# Patient Record
Sex: Female | Born: 1935 | Race: White | Hispanic: No | Marital: Married
Health system: Southern US, Community
[De-identification: ages and names within clinical notes are randomized; demographics above are authoritative.]

---

## 2003-06-09 ENCOUNTER — Other Ambulatory Visit: Payer: Self-pay

## 2004-07-17 ENCOUNTER — Ambulatory Visit: Payer: Self-pay | Admitting: Unknown Physician Specialty

## 2004-12-26 ENCOUNTER — Ambulatory Visit: Payer: Self-pay | Admitting: Internal Medicine

## 2005-01-16 ENCOUNTER — Ambulatory Visit: Payer: Self-pay | Admitting: Vascular Surgery

## 2005-03-19 ENCOUNTER — Ambulatory Visit: Payer: Self-pay | Admitting: Internal Medicine

## 2006-03-20 ENCOUNTER — Ambulatory Visit: Payer: Self-pay | Admitting: Internal Medicine

## 2007-04-01 ENCOUNTER — Ambulatory Visit: Payer: Self-pay | Admitting: Internal Medicine

## 2008-04-28 ENCOUNTER — Inpatient Hospital Stay: Payer: Self-pay | Admitting: Internal Medicine

## 2008-08-20 ENCOUNTER — Ambulatory Visit: Payer: Self-pay

## 2010-03-15 ENCOUNTER — Emergency Department: Payer: Self-pay | Admitting: Emergency Medicine

## 2011-02-21 ENCOUNTER — Emergency Department: Payer: Self-pay | Admitting: Unknown Physician Specialty

## 2011-06-19 ENCOUNTER — Emergency Department: Payer: Self-pay | Admitting: Emergency Medicine

## 2011-06-19 LAB — URINALYSIS, COMPLETE
Bacteria: NONE SEEN
Bilirubin,UR: NEGATIVE
Blood: NEGATIVE
Glucose,UR: NEGATIVE mg/dL (ref 0–75)
Nitrite: NEGATIVE
Ph: 7 (ref 4.5–8.0)
Protein: NEGATIVE
RBC,UR: NONE SEEN /HPF (ref 0–5)
Specific Gravity: 1.004 (ref 1.003–1.030)
Squamous Epithelial: <1
WBC UR: <1 /HPF (ref 0–5)

## 2011-06-19 LAB — COMPREHENSIVE METABOLIC PANEL
Alkaline Phosphatase: 53 U/L (ref 50–136)
BUN: 30 mg/dL — ABNORMAL HIGH (ref 7–18)
Bilirubin,Total: 0.3 mg/dL (ref 0.2–1.0)
Chloride: 101 mmol/L (ref 98–107)
Co2: 28 mmol/L (ref 21–32)
Creatinine: 1.56 mg/dL — ABNORMAL HIGH (ref 0.60–1.30)
EGFR (African American): 40 — ABNORMAL LOW
EGFR (Non-African Amer.): 33 — ABNORMAL LOW
Potassium: 4.1 mmol/L (ref 3.5–5.1)
SGPT (ALT): 17 U/L
Sodium: 143 mmol/L (ref 136–145)
Total Protein: 7.7 g/dL (ref 6.4–8.2)

## 2011-06-19 LAB — CBC
HCT: 35.8 % (ref 35.0–47.0)
HGB: 12.1 g/dL (ref 12.0–16.0)
MCV: 98 fL (ref 80–100)
RBC: 3.64 10*6/uL — ABNORMAL LOW (ref 3.80–5.20)
WBC: 7.9 10*3/uL (ref 3.6–11.0)

## 2011-06-19 LAB — LIPASE, BLOOD: Lipase: 343 U/L (ref 73–393)

## 2013-02-01 ENCOUNTER — Inpatient Hospital Stay: Payer: Self-pay | Admitting: Internal Medicine

## 2013-02-01 LAB — COMPREHENSIVE METABOLIC PANEL
Albumin: 3 g/dL — ABNORMAL LOW (ref 3.4–5.0)
BUN: 40 mg/dL — ABNORMAL HIGH (ref 7–18)
Chloride: 100 mmol/L (ref 98–107)
EGFR (African American): 25 — ABNORMAL LOW
EGFR (Non-African Amer.): 22 — ABNORMAL LOW
Osmolality: 283 (ref 275–301)
Potassium: 4.3 mmol/L (ref 3.5–5.1)
SGOT(AST): 42 U/L — ABNORMAL HIGH (ref 15–37)
SGPT (ALT): 21 U/L (ref 12–78)
Sodium: 136 mmol/L (ref 136–145)

## 2013-02-01 LAB — URINALYSIS, COMPLETE
Bilirubin,UR: NEGATIVE
Blood: NEGATIVE
Glucose,UR: NEGATIVE mg/dL (ref 0–75)
Hyaline Cast: 27
Ph: 6 (ref 4.5–8.0)
Protein: 25
Squamous Epithelial: 7

## 2013-02-01 LAB — CBC
HCT: 40.6 % (ref 35.0–47.0)
HGB: 13.6 g/dL (ref 12.0–16.0)
MCHC: 33.4 g/dL (ref 32.0–36.0)
MCV: 96 fL (ref 80–100)
RDW: 13.4 % (ref 11.5–14.5)

## 2013-02-02 LAB — CBC WITH DIFFERENTIAL/PLATELET
Basophil #: 0 10*3/uL (ref 0.0–0.1)
Basophil %: 0.2 %
HGB: 12 g/dL (ref 12.0–16.0)
Lymphocyte #: 1.5 10*3/uL (ref 1.0–3.6)
MCHC: 33.7 g/dL (ref 32.0–36.0)
Monocyte #: 1.1 x10 3/mm — ABNORMAL HIGH (ref 0.2–0.9)
Monocyte %: 8.9 %
Neutrophil %: 74.8 %
Platelet: 274 10*3/uL (ref 150–440)

## 2013-02-02 LAB — BASIC METABOLIC PANEL
Anion Gap: 7 (ref 7–16)
BUN: 36 mg/dL — ABNORMAL HIGH (ref 7–18)
Chloride: 105 mmol/L (ref 98–107)
EGFR (African American): 31 — ABNORMAL LOW
EGFR (Non-African Amer.): 26 — ABNORMAL LOW
Glucose: 91 mg/dL (ref 65–99)
Osmolality: 287 (ref 275–301)
Sodium: 140 mmol/L (ref 136–145)

## 2013-02-03 LAB — COMPREHENSIVE METABOLIC PANEL
Alkaline Phosphatase: 57 U/L (ref 50–136)
Bilirubin,Total: 0.4 mg/dL (ref 0.2–1.0)
Chloride: 104 mmol/L (ref 98–107)
Creatinine: 1.56 mg/dL — ABNORMAL HIGH (ref 0.60–1.30)
EGFR (African American): 33 — ABNORMAL LOW
Sodium: 138 mmol/L (ref 136–145)

## 2013-02-03 LAB — WBC: WBC: 10.8 10*3/uL (ref 3.6–11.0)

## 2013-02-04 LAB — URINE CULTURE

## 2013-02-06 LAB — CULTURE, BLOOD (SINGLE)

## 2013-09-11 ENCOUNTER — Ambulatory Visit: Payer: Self-pay | Admitting: Nurse Practitioner

## 2013-09-18 ENCOUNTER — Inpatient Hospital Stay: Payer: Self-pay | Admitting: Student

## 2013-09-18 LAB — CBC
HCT: 35.3 % (ref 35.0–47.0)
HGB: 12 g/dL (ref 12.0–16.0)
MCH: 33.5 pg (ref 26.0–34.0)
MCHC: 34 g/dL (ref 32.0–36.0)
MCV: 99 fL (ref 80–100)
PLATELETS: 171 10*3/uL (ref 150–440)
RBC: 3.58 10*6/uL — ABNORMAL LOW (ref 3.80–5.20)
RDW: 14.4 % (ref 11.5–14.5)
WBC: 10 10*3/uL (ref 3.6–11.0)

## 2013-09-18 LAB — URINALYSIS, COMPLETE
Bilirubin,UR: NEGATIVE
Blood: NEGATIVE
GLUCOSE, UR: NEGATIVE mg/dL (ref 0–75)
NITRITE: NEGATIVE
PH: 5 (ref 4.5–8.0)
Protein: NEGATIVE
SPECIFIC GRAVITY: 1.018 (ref 1.003–1.030)
Squamous Epithelial: 1
WBC UR: 2 /HPF (ref 0–5)

## 2013-09-18 LAB — COMPREHENSIVE METABOLIC PANEL
Albumin: 3.4 g/dL (ref 3.4–5.0)
Alkaline Phosphatase: 51 U/L
Anion Gap: 8 (ref 7–16)
BUN: 21 mg/dL — ABNORMAL HIGH (ref 7–18)
Bilirubin,Total: 0.5 mg/dL (ref 0.2–1.0)
CALCIUM: 9.1 mg/dL (ref 8.5–10.1)
CREATININE: 1.11 mg/dL (ref 0.60–1.30)
Chloride: 100 mmol/L (ref 98–107)
Co2: 26 mmol/L (ref 21–32)
EGFR (African American): 49 — ABNORMAL LOW
GFR CALC NON AF AMER: 42 — AB
GLUCOSE: 112 mg/dL — AB (ref 65–99)
Osmolality: 272 (ref 275–301)
POTASSIUM: 4.2 mmol/L (ref 3.5–5.1)
SGOT(AST): 29 U/L (ref 15–37)
SGPT (ALT): 12 U/L (ref 12–78)
Sodium: 134 mmol/L — ABNORMAL LOW (ref 136–145)
Total Protein: 7.4 g/dL (ref 6.4–8.2)

## 2013-09-18 LAB — PROTIME-INR
INR: 1.1
PROTHROMBIN TIME: 13.9 s (ref 11.5–14.7)

## 2013-09-18 LAB — APTT: ACTIVATED PTT: 37.3 s — AB (ref 23.6–35.9)

## 2013-09-19 LAB — CBC WITH DIFFERENTIAL/PLATELET
BASOS ABS: 0 10*3/uL (ref 0.0–0.1)
Basophil %: 0.1 %
Eosinophil #: 0 10*3/uL (ref 0.0–0.7)
Eosinophil %: 0.1 %
HCT: 21.8 % — ABNORMAL LOW (ref 35.0–47.0)
HGB: 7.1 g/dL — ABNORMAL LOW (ref 12.0–16.0)
Lymphocyte #: 1 10*3/uL (ref 1.0–3.6)
Lymphocyte %: 7.6 %
MCH: 32.2 pg (ref 26.0–34.0)
MCHC: 32.5 g/dL (ref 32.0–36.0)
MCV: 99 fL (ref 80–100)
MONO ABS: 2 x10 3/mm — AB (ref 0.2–0.9)
MONOS PCT: 15 %
NEUTROS ABS: 10.1 10*3/uL — AB (ref 1.4–6.5)
NEUTROS PCT: 77.2 %
Platelet: 140 10*3/uL — ABNORMAL LOW (ref 150–440)
RBC: 2.2 10*6/uL — ABNORMAL LOW (ref 3.80–5.20)
RDW: 14 % (ref 11.5–14.5)
WBC: 13.1 10*3/uL — AB (ref 3.6–11.0)

## 2013-09-19 LAB — BASIC METABOLIC PANEL
ANION GAP: 6 — AB (ref 7–16)
BUN: 21 mg/dL — AB (ref 7–18)
CO2: 27 mmol/L (ref 21–32)
Calcium, Total: 7.7 mg/dL — ABNORMAL LOW (ref 8.5–10.1)
Chloride: 102 mmol/L (ref 98–107)
Creatinine: 1.46 mg/dL — ABNORMAL HIGH (ref 0.60–1.30)
EGFR (African American): 35 — ABNORMAL LOW
EGFR (Non-African Amer.): 30 — ABNORMAL LOW
GLUCOSE: 101 mg/dL — AB (ref 65–99)
Osmolality: 273 (ref 275–301)
Potassium: 4.5 mmol/L (ref 3.5–5.1)
SODIUM: 135 mmol/L — AB (ref 136–145)

## 2013-09-19 LAB — HEMOGLOBIN: HGB: 8.5 g/dL — ABNORMAL LOW (ref 12.0–16.0)

## 2013-09-20 LAB — BASIC METABOLIC PANEL
ANION GAP: 5 — AB (ref 7–16)
BUN: 27 mg/dL — ABNORMAL HIGH (ref 7–18)
CALCIUM: 7.9 mg/dL — AB (ref 8.5–10.1)
CHLORIDE: 99 mmol/L (ref 98–107)
CO2: 25 mmol/L (ref 21–32)
CREATININE: 1.59 mg/dL — AB (ref 0.60–1.30)
EGFR (African American): 32 — ABNORMAL LOW
EGFR (Non-African Amer.): 28 — ABNORMAL LOW
Glucose: 117 mg/dL — ABNORMAL HIGH (ref 65–99)
Osmolality: 265 (ref 275–301)
Potassium: 4.5 mmol/L (ref 3.5–5.1)
Sodium: 129 mmol/L — ABNORMAL LOW (ref 136–145)

## 2013-09-20 LAB — HEMOGLOBIN: HGB: 7.2 g/dL — ABNORMAL LOW (ref 12.0–16.0)

## 2013-09-21 LAB — BASIC METABOLIC PANEL
ANION GAP: 6 — AB (ref 7–16)
BUN: 29 mg/dL — ABNORMAL HIGH (ref 7–18)
Calcium, Total: 8.4 mg/dL — ABNORMAL LOW (ref 8.5–10.1)
Chloride: 99 mmol/L (ref 98–107)
Co2: 26 mmol/L (ref 21–32)
Creatinine: 1.33 mg/dL — ABNORMAL HIGH (ref 0.60–1.30)
EGFR (African American): 40 — ABNORMAL LOW
GFR CALC NON AF AMER: 34 — AB
Glucose: 101 mg/dL — ABNORMAL HIGH (ref 65–99)
Osmolality: 269 (ref 275–301)
Potassium: 4.9 mmol/L (ref 3.5–5.1)
Sodium: 131 mmol/L — ABNORMAL LOW (ref 136–145)

## 2013-09-21 LAB — HEMOGLOBIN: HGB: 8.7 g/dL — ABNORMAL LOW (ref 12.0–16.0)

## 2013-09-22 LAB — HEMOGLOBIN: HGB: 7.9 g/dL — ABNORMAL LOW (ref 12.0–16.0)

## 2013-09-23 LAB — HEMOGLOBIN: HGB: 8.2 g/dL — AB (ref 12.0–16.0)

## 2013-09-24 ENCOUNTER — Encounter: Payer: Self-pay | Admitting: Internal Medicine

## 2013-10-12 ENCOUNTER — Encounter: Payer: Self-pay | Admitting: Internal Medicine

## 2013-10-12 ENCOUNTER — Ambulatory Visit
Admit: 2013-10-12 | Disposition: A | Discharge status: Home / Self Care | Payer: Self-pay | Attending: Nurse Practitioner | Admitting: Nurse Practitioner

## 2013-11-11 ENCOUNTER — Encounter: Payer: Self-pay | Admitting: Internal Medicine

## 2013-12-12 ENCOUNTER — Encounter: Payer: Self-pay | Admitting: Internal Medicine

## 2014-01-12 ENCOUNTER — Encounter: Payer: Self-pay | Admitting: Internal Medicine

## 2014-02-11 ENCOUNTER — Encounter: Payer: Self-pay | Admitting: Internal Medicine

## 2014-03-14 ENCOUNTER — Encounter: Payer: Self-pay | Admitting: Internal Medicine

## 2014-04-13 ENCOUNTER — Encounter: Payer: Self-pay | Admitting: Internal Medicine

## 2014-05-14 ENCOUNTER — Encounter: Payer: Self-pay | Admitting: Internal Medicine

## 2014-06-14 ENCOUNTER — Encounter: Payer: Self-pay | Admitting: Internal Medicine

## 2014-07-13 ENCOUNTER — Encounter
Admit: 2014-07-13 | Disposition: A | Discharge status: Home / Self Care | Payer: Self-pay | Attending: Internal Medicine | Admitting: Internal Medicine

## 2014-08-13 ENCOUNTER — Encounter
Admit: 2014-08-13 | Disposition: A | Discharge status: Home / Self Care | Payer: Self-pay | Attending: Internal Medicine | Admitting: Internal Medicine

## 2014-09-03 NOTE — Discharge Summary (Signed)
PATIENT NAME:  Crystal RougeCOMPTON, Crystal [name] MR#:  [id number] DATE OF BIRTH:  12-16-1917  DATE OF ADMISSION:  02/01/2013 PRELIMINARY DATE OF DISCHARGE:  02/06/2013  ADMITTING DIAGNOSIS: Systemic inflammatory response reaction.   DISCHARGE DIAGNOSES: 1.  Systemic inflammatory response syndrome due to urinary tract infection Escherichia coli as well as Proteus mirabilis.  2.  Acute on chronic renal failure, resolved.  3.  Mild dehydration, resolved.  4.  Leukocytosis resolved.  5.  Hypotension of unclear etiology.  6.  Right femoral profunda vein suspected tiny thrombus.  7.  Urinary incontinence, likely due to urinary tract infection. 8.  History of hypertension, hyperlipidemia, coronary artery disease, dementia, recurrent urinary tract infections as well as fall, also constipation.   DISCHARGE CONDITION: Stable.   DISCHARGE MEDICATIONS: The patient is to resume: 1.  Actonel 35 mg p.o. weekly. 2.  AZO cranberry tablets 2 tablets 450 mg once daily.  3. Simvastatin 5 mg daily at bedtime.  4.  Aspirin 81 mg daily. 5.  Plavix 75 mg daily. 6.  Multivitamin once daily. 7.  Calcium with vitamin D once daily. 8. Ranitidine 150 mg daily. 9. Biotene mouthwash oral solution, 30 mL twice daily. 10.  Iron sulfate 325 mg p.o. once daily at bedtime.  11.  Albuterol inhaler 2 puffs every 6 hours as needed.  12.  Keflex 500 mg p.o. twice daily for 5 more days.  13.  Docusate sodium 100 mg p.o. twice daily as needed.  14.  Oxybutynin extended release 10 mg once daily. 15.  Calciferol 1000 units once daily.   16.  The patient was advised not to take Lasix and metoprolol unless recommended by primary care physician.   HOME OXYGEN: None.   DIET: 2 grams salt, low fat, low cholesterol.   ACTIVITY LIMITATIONS: As tolerated.    FOLLOWUP APPOINTMENT: With Dr. Lafayette Dragonarr in 2 days after discharge.    CONSULTANTS: Care management, to Dr. Gilda CreaseSchnier, social work.   RADIOLOGIC STUDIES: Chest PA and lateral, 02/01/2013,  showed no acute cardiopulmonary disease and stable appearance since prior x-ray. CT of head without contrast, 02/01/2013, revealed no acute intracranial process. Ultrasound of kidneys, bilateral, 02/02/2013, revealed cortical thinning in both kidneys. No obstructive change. Possible bladder mass was described. Ultrasound of bilateral lower extremities 02/03/2013, revealed possible tiny thrombus in the right femoral profunda vein. Repeated ultrasound of the right lower extremity is pending today on the 02/06/2013.   HISTORY OF PRESENT ILLNESS: The patient is 81 year old Caucasian female with past medical history significant for history of hypertension, hyperlipidemia, coronary artery disease, and UTIs who presents to the hospital with complaints of fall.  She apparently fell down in the bathroom at [facility]. She was found to be initially tachycardic and have a urinary tract infection. She was admitted for systemic inflammatory response reaction due to urinary tract infection. On arrival to the Emergency Room, the patient was afebrile with pulse of 71, blood pressure 110/61, O2 sats was 96% on room air.   PHYSICAL EXAM: Unremarkable.   LABORATORY DATA:  Done on admission, 02/01/2013 revealed BUN and creatinine 40 and 1.93, glucose 113, otherwise BMP was unremarkable. Liver enzymes revealed mild elevation of AST to 42 and albumin level of 3.0. White blood cell count was elevated to 15.6, hemoglobin was 13.6, platelet count 313. Blood cultures taken on the 02/01/2013 showed no growth. Urinalysis revealed yellow cloudy urine, negative for glucose, bilirubin or ketones. Specific gravity was 1.010, pH was 6.0, negative for blood, 25 mg/dL protein, negative for  nitrites, 3+ leukocyte esterase, 14 red blood cells, 757 white blood cells, trace bacteria, 7 epithelial cells. White blood cell clumps were present, as well as mucus and 27 hyaline casts. Urine cultures revealed at 80,000 colony-forming units of Escherichia  coli, sensitive to all antibiotics but resistant to ciprofloxacin, intermittently resistant to cefoxitin as well as resistant to levofloxacin as well as 10,000 colony forming units of Proteus mirabilis resistant to nitrofurantoin also ciprofloxacin, levofloxacin as well as intermittently sensitive to cefoxitin.    HOSPITAL COURSE:  The patient was started on antibiotic therapy with Rocephin IV and her condition improved. She was also given IV fluids. With this, her condition significantly improved.  Whenever the patient's urine cultures came back and identification came back, the patient's antibiotics were changed to Keflex. The patient is to continue antibiotic for 5 days to complete course. In regards to leukocytosis, the patient's white blood cell count elevation decreased with antibiotic therapy and on the 02/03/2013, the patient's white blood cell count was normal at 10.8.  With IV fluid administration, the patient's acute on chronic renal failure also resolved. Creatinine was  1.93 on day of admission and returned back to her baseline of 1.56 by the 02/03/2013.  The patient, however remained somewhat hypotensive with systolic blood pressure ranging from 107 to 117 over the past 2 days; however, the patient's heart rate remained stable and 60s to 70s.  Her hypotension remains unclear; however, the patient's oxygenation was as high as 98% on room air at rest.  I did not feel that she has significant clinically pulmonary emboli if at all.  We felt it could have been related to just, mild dehydration and held her blood pressure medications.  We however, investigated her for lower extremity edema with Doppler ultrasound and we found that she had what the radiologist suspected, tiny thrombus in the right femoral profunda vein. We consulted Dr. Gilda Crease who felt that this thrombosis is too tiny and it would pass IVC filter if one was placed, however, he did not feel the patient is a candidate for chronic  anticoagulation so he did not recommend anticoagulation therapy; however he recommended to repeat the patient's Doppler ultrasound on Friday, 02/06/2013. If thrombus is not seen, he felt that the patient is okay to return back to skilled nursing facility with aspirin as well as Plavix but not anticoagulated therapy.  Doppler ultrasound of right lower extremity still pending at the time of dictation. If it is normal, the patient will be discharged back home to skilled nursing facility with above-mentioned medications, aspirin as well as Plavix with no significant other interventions planned. In regards to hypotension; however, the patient is to hold her blood pressure medications until she is seen by primary care physician and decision made for her blood pressure management.   DISPOSITION:  The patient is being discharged in stable condition with the above-mentioned medications and follow-up.   VITAL SIGNS ON THE DAY OF DISCHARGE: Temperature 98.1, pulse was 76, respiratory rate was 18, blood pressure 107/69, saturation 98% on room air at rest.   TIME SPENT: 40 minutes on this patient.    ____________________________ Katharina Caper, MD rv:dp D: 02/06/2013 09:58:39 ET T: 02/06/2013 10:36:56 ET JOB#: [id number]  cc: Katharina Caper, MD, <Dictator> Louretta Parma, NP Emri Sample MD ELECTRONICALLY SIGNED 02/09/2013 9:42

## 2014-09-03 NOTE — H&P (Signed)
PATIENT NAME:  Crystal [name], Crystal [name] MR#:  [id number] DATE OF BIRTH:  August 30, 1917  DATE OF ADMISSION:  02/01/2013  PRIMARY CARE PHYSICIAN:   Dr. Verlin GrillsKristina Carr  The patient is a resident of [facility].   CHIEF COMPLAINT:  Fall today, patient found to have UTI and SIRS.   HISTORY OF PRESENT ILLNESS: Ms. Crystal [name] is a 81 year old Caucasian female with a past medical history of dementia, recurrent falls, history of hypertension and hyperlipidemia, who comes to the Emergency Room after she had a fall in [facility] bathroom [facility]. She denies any pain anywhere, is able to move all her extremities well. During evaluation in the Emergency Room, she was found to be tachycardic initially on arrival, and does have a UTI. She is being admitted for SIRS secondary to UTI. The patient is alert, oriented x 3. Her blood pressure is stable. She is receiving IV fluids. She has been started on IV Cipro by me. Blood cultures have been drawn. Her CT head is negative for any acute intracranial abnormality.   PAST MEDICAL HISTORY: 1.  Hyperlipidemia.  2.  Hypertension.  3.  History of non-STEMI in 2009. 4.  History of recurrent UTI.  5.  History of dementia.  6.  Recurrent falls.   PAST SURGICAL HISTORY: 1.  Right partial colectomy in 2005.  2.  Hysterectomy.   ALLERGIES:  LASIX and PENICILLIN.   MEDICATIONS: 1.  Actonel 35 mg p.o. weekly on Saturdays.  2.  Aspirin 81 mg daily.  3.  Plavix 75 mg daily.  4.  Cranberry tablet 450 mg 2 tablets p.o. daily.  5.  Lasix 40 mg daily.  6.  Multivitamin p.o. daily.  7.  Os-Cal with vitamin D 1 p.o. daily.  8.  Ranitidine 150 mg p.o. daily.  9.  Biotene dry mouth oral rinse daily.  10.  Metoprolol 25 mg 1/2 tablet, which is 12.5 mg [name].i.d.  11.  Ensure liquid chocolate drank p.o. at bedtime.  12.  Ferrous sulfate 325 mg at bedtime.  13.  Lasix 20 mg at bedtime.  14.  Simvastatin 5 mg at bedtime.  Patient just finished a course of Levaquin recently at [facility].  FAMILY  HISTORY:  Per old records, mother died of CVA, father died at unknown age of CVA and diabetes.   SOCIAL HISTORY: She is widowed, lives at [facility]. No history of children. [place] She quit smoking many years ago.   REVIEW OF SYSTEMS:   CONSTITUTIONAL: Positive for fatigue, weakness. No fever.  EYES: No blurred or double vision, glaucoma or cataracts.  EARS, NOSE, THROAT: No tinnitus, ear pain, hearing loss or postnasal drip.  RESPIRATORY: No cough, wheeze, hemoptysis, dyspnea or COPD. CARDIOVASCULAR: No chest pain, orthopnea, edema or arrhythmia.  GASTROINTESTINAL: No nausea, vomiting, diarrhea, abdominal pain or hematemesis.  GENITOURINARY: Positive for dysuria and frequency. No hematuria.  ENDOCRINE: No polyuria, nocturia or thyroid problems.  HEMATOLOGY: No anemia or easy bruising. The patient has no history of bleeding.  SKIN: No acne, rash or lesions.  MUSCULOSKELETAL: Positive for arthritis and back pain. No swelling or gout.  NEUROLOGIC:  No CVA, TIA, seizures or dysarthria. Positive for dementia. PSYCHIATRIC:  No anxiety or depression or bipolar disorder.  All other systems reviewed are negative.   PHYSICAL EXAMINATION: GENERAL: The patient is awake, alert, oriented x 3, not in acute distress.  VITAL SIGNS:  Afebrile, pulse is 71, blood pressure is 110/61, sats are 96% on room  air.  HEENT: Atraumatic, normocephalic. Pupils PERLA.  EOM intact. Oral mucosa is dry.  NECK: Supple. No JVD. No carotid bruit.  RESPIRATORY: Clear to auscultation bilaterally. No rales, rhonchi, respiratory distress or labored breathing.  CARDIOVASCULAR: Both the heart sounds are normal. Rate, rhythm regular. PMI not lateralized. Chest nontender. Good pedal pulses, good femoral pulses. No lower extremity edema.  ABDOMEN:  Soft, benign, nontender. No organomegaly. Positive bowel sounds.  NEUROLOGIC: Grossly intact cranial nerves II through XII. No motor or sensory deficit. The  patient has generalized weakness. No focal deficits.  SKIN: Warm and dry.  PSYCHIATRIC: The patient is awake, alert, oriented x 2.   DATA:  CT of the head shows no acute intracranial process. White count is 15.6, H and H is 13.6 and 40.6, platelet count is 313. Glucose is 113, BUN 40, creatinine 1.93, sodium 136, potassium is 4.3, chloride 100, bicarb is 29, calcium is 9.7. UA positive for UTI.   ASSESSMENT: [name] is a 81 year old with history of dementia, hypertension and history of frequent falls, along with urinary tract infection, comes in with:  1. Systemic inflammatory response syndrome secondary to urinary tract infection. The patient will be admitted on the medical floor. Will start patient on IV Cipro [name].i.d., continue her cranberry pills, give her IV fluids for hydration, follow blood cultures, urine cultures and white count.  2.  Frequent falls. The patient normally walks with a walker, uses a wheelchair too at [facility]. Will have physical therapy see patient in evaluation for gait training and ambulation and safety measures.  3.  Hyperlipidemia. Continue simvastatin.  4.  Hypertension. Will continue patient on metoprolol 12.5 [name].i.d. 5.  Acute renal insufficiency/dehydration. Will hold off on Lasix at this time.  6.  History of coronary artery disease. Appears stable. Will continue on Plavix and baby aspirin.  7.  Deep venous thrombosis prophylaxis with subcutaneous Lovenox.   Further workup according to patient's clinical course.   Hospital admission plan was discussed with the patient. No family members were present in the Emergency Room.   TIME SPENT:  55 minutes.   ____________________________ Wylie Hail Allena Katz, MD sap:mr D: 02/01/2013 19:00:22 ET T: 02/01/2013 19:34:02 ET JOB#: [id number]  cc: Julianne Chamberlin A. Allena Katz, MD, <Dictator> Louretta Parma, NP  Willow Ora MD ELECTRONICALLY SIGNED 02/02/2013 13:14

## 2014-09-03 NOTE — Consult Note (Signed)
Present Illness The patient is a 81 year old female with a past medical history of dementia, recurrent falls, history of hypertension and hyperlipidemia, who presented to the Emergency Room after she had a fall in the bathroom at [facility]. At the time of admission she denied pain and was able to move all her extremities well. During evaluation in the Emergency Room, she was found to be tachycardic initially on arrival, and does have a UTI. She is being admitted for SIRS secondary to UTI. The patient is alert, oriented x 3. Her blood pressure is stable. She has been started on IV Cipro by me. Blood cultures have been drawn. Her CT head is negative for any acute intracranial abnormality. Duplex ultrasound of the leg shows a "tiny" thrombus in the right deep femoral (profunda femorus) vein.  PAST MEDICAL HISTORY: 1.  Hyperlipidemia.  2.  Hypertension.  3.  History of non-STEMI in 2009. 4.  History of recurrent UTI.  5.  History of dementia.  6.  Recurrent falls.   PAST SURGICAL HISTORY: 1.  Right partial colectomy in 2005.  2.  Hysterectomy.   Home Medications: Medication Instructions Status  Levaquin 500 mg oral tablet 1 tab(s) orally once a day x 10 days. *start date 01/31/13 Active  Actonel 35 mg oral tablet 1 tab(s) orally once a week on Saturday 30 min before a meal on empty stomach with 8oz of water. Active  Azo-Cranberry oral tablet take 2 of the (450 milligrams) tabs orally once a day. Active  simvastatin 5 mg oral tablet 1 tab(s) orally once a day (at bedtime) Active  albuterol CFC free 90 mcg/inh inhalation aerosol 2 puff(s) inhaled every 6 hours as needed for dyspnea. Active  Aspirin Enteric Coated 81 mg oral delayed release tablet 1 tab(s) orally once a day. *do not crush* Active  clopidogrel 75 mg oral tablet 1 tab(s) orally once a day Active  furosemide 40 mg oral tablet 1 tab(s) orally once a day (in the morning) Active  multivitamin 1 tab(s) orally once a day Active  Oyst-Cal-D  500 mg-200 intl units oral tablet 1 tab(s) orally once a day Active  ranitidine 150 mg oral tablet 1 tab(s) orally once a day (in the morning) Active  Biotene Mouthwash - oral solution rinse and spit 30 milliliter(s) orally 2 times a day.  Active  Metoprolol Tartrate 25 mg oral tablet 0.5 tab (12.6m) orally 2 times a day. *check bp prior to administration and hold med for bp<100/60 or hr <60* Active  ferrous sulfate 325 mg (65 mg elemental iron) oral tablet 1 tab(s) orally once a day (at bedtime). *do not crush* Active  furosemide 40 mg oral tablet 0.5 tab (278m orally once a day (at bedtime). Active    Penicillin: Unknown  Lasix: Unknown  Case History:  Family History Non-Contributory   Social History negative tobacco, negative ETOH, negative Illicit drugs   Review of Systems:  Fever/Chills No   Cough No   Sputum No   Abdominal Pain No   Diarrhea No   Constipation No   Nausea/Vomiting No   SOB/DOE No   Chest Pain No   Physical Exam:  GEN well developed, well nourished, no acute distress   HEENT hearing intact to voice, dry oral mucosa, poor dentition   NECK supple  trachea midline   RESP normal resp effort  no use of accessory muscles   CARD No LE edema  no JVD   ABD denies tenderness  soft  nondistended  EXTR negative cyanosis/clubbing, negative edema   SKIN No ulcers, skin turgor poor   NEURO follows commands, motor/sensory function intact   PSYCH alert, poor insight   Nursing/Ancillary Notes: **Vital Signs.:   23-Sep-14 13:48  Vital Signs Type Routine  Temperature Temperature (F) 98.5  Celsius 36.9  Temperature Source oral  Pulse Pulse 72  Respirations Respirations 20  Systolic BP Systolic BP 947  Diastolic BP (mmHg) Diastolic BP (mmHg) 68  Mean BP 83  Pulse Ox % Pulse Ox % 94  Pulse Ox Activity Level  At rest  Oxygen Delivery Room Air/ 21 %   Hepatic:  23-Sep-14 03:57   Bilirubin, Total 0.4  Alkaline Phosphatase 57  SGPT (ALT) 16   SGOT (AST) 31  Total Protein, Serum 6.4  Albumin, Serum  2.6  Routine Chem:  23-Sep-14 03:57   Glucose, Serum 88  BUN  29  Creatinine (comp)  1.56  Sodium, Serum 138  Potassium, Serum 3.8  Chloride, Serum 104  CO2, Serum 29  Calcium (Total), Serum 8.9  Osmolality (calc) 281  eGFR (African American)  33  eGFR (Non-African American)  28 (eGFR values <50m/min/1.73 m2 may be an indication of chronic kidney disease (CKD). Calculated eGFR is useful in patients with stable renal function. The eGFR calculation will not be reliable in acutely ill patients when serum creatinine is changing rapidly. It is not useful in  patients on dialysis. The eGFR calculation may not be applicable to patients at the low and high extremes of body sizes, pregnant women, and vegetarians.)  Anion Gap  5  Routine Hem:  23-Sep-14 03:57   WBC (CBC) 10.8 (Result(s) reported on 03 Feb 2013 at 04:49AM.)   [place]    23-Sep-14 11:30, [place] Flow Doppler Low Extrem Bilat (Legs)  [place] Flow Doppler Low Extrem Bilat (Legs)   REASON FOR EXAM:    pain on calf palpation, r/o DVT  COMMENTS:       PROCEDURE: [place] - UKorea[place]OPPLER LOW EXTR BILATERAL  - Feb 03 2013 11:30AM     RESULT: History: Pain.    Comparison Study: No recent.    Findings: Bilateral lower extremity color flow duplex Doppler reveals no   prominent deep venous thrombosis. A tiny nonocclusive thrombus in the   right femoral profundus vein cannot be excluded.    IMPRESSION:  Cannot exclude tiny thrombus in the right femoral profundus   vein.    Verified By: TOsa Craver M.D., MD    Impression 1. R femoral DVT, started eliquis.  Given teh findings on ultrasound of a "very tiny" clot in the profunda femorus vein in association with her age and her frequent falls I would not fully anticoagulate her at this tiem.  I would continue Eliquis as a DVT prophylaxis and repeat the duplex in 3 days to monitor for progression.  Her risk for PE from a  small thrombus is quite minimal her risks of full anticoagulation are certainly higher. 2. UTI, suspected ac pyelonephritis, ucx 80 thous cfu e. coli, and other GNR, dc cipro as it is resistant to it, changhe tpo kjeflex if pt can take it or nitrofurantoin, attempted to call daughter, but no answer, apparently daughter was in the hospital, went to get some lunch, we'll talk when she is back 3. ac on chr.  renal failure, better with IVF, now at baseline, [place] bladder showed poss maass,needs urology evaluation as outpt 4. leukocytosis, resolved with antibiotic 5.hypotension, holding BP meds, continue  IVF   Plan level 4 consult   Electronic Signatures: Hortencia Pilar (MD)  (Signed 23-Sep-14 18:38)  Authored: General Aspect/Present Illness, Home Medications, Allergies, History and Physical Exam, Vital Signs, Labs, Radiology, Impression/Plan   Last Updated: 23-Sep-14 18:38 by Hortencia Pilar (MD)

## 2014-09-03 NOTE — Discharge Summary (Signed)
PATIENT NAME:  Crystal [name], Crystal [name] MR#:  [id number] DATE OF BIRTH:  1917-07-04  DATE OF ADMISSION:  02/01/2013 DATE OF DISCHARGE:  02/06/2013  ADDENDUM:  The patient had her repeated ultrasound of the right lower extremity done on the 26th of September 2014 which revealed unchanged findings. There were findings consistent with stable nonocclusive area of mural thrombus within the proximal right profunda femoral vein. No further evidence to suggest deep vein thrombosis within the remaining and interrogated vessels was noted. Since the patient is not a candidate for  chronic anticoagulation therapy, she is being discharged home on aspirin as well as Plavix and no further recommendations were made by vascular surgery. The patient is being discharged in stable condition as above-mentioned with the above-mentioned medications and followup.   TIME SPENT: Additional 10 minutes.    ____________________________ Katharina Caperima Czarina Gingras, MD rv:cs D: 02/06/2013 15:51:17 ET T: 02/06/2013 16:05:43 ET JOB#: [id number]  cc: Katharina Caperima Jamielyn Petrucci, MD, <Dictator> Naasir Carreira MD ELECTRONICALLY SIGNED 02/09/2013 9:42

## 2014-09-04 NOTE — Op Note (Signed)
PATIENT NAME:  Crystal RougeCOMPTON, Mykael B MR#:  [id number] DATE OF BIRTH:  21-Nov-1917  DATE OF PROCEDURE:  09/18/2013  PREOPERATIVE DIAGNOSIS:  Comminuted displaced left intertrochanteric hip fracture.   POSTOPERATIVE DIAGNOSIS:  Comminuted displaced left intertrochanteric hip fracture.   PROCEDURE PERFORMED:  Open reduction and internal fixation of the left hip with a trochanteric fixation nail (125 degrees/11 mm rod, 85 mm helical blade, 36 mm distal screw).   SURGEON:  Valinda HoarHoward E. Heide Brossart, M.D.   ANESTHESIA:  General endotracheal.   COMPLICATIONS:  None.   DRAINS:  None.   ESTIMATED BLOOD LOSS:  100 mL.   REPLACEMENTS:  None.   DESCRIPTION OF PROCEDURE:  The patient was brought to the Operating Room where she underwent satisfactory general endotracheal anesthesia in the supine position. The patient was on Plavix and aspirin preoperatively precluding the use of a spinal anesthetic. The right leg was flexed and abducted and the left leg placed in traction and internally rotated. Fluoroscopy showed satisfactory position of the fracture fragments with the inferior spike of the neck extending down along the medial border of the shaft. The hip was then prepped and draped in sterile fashion. A short longitudinal incision was made just above the trochanter and dissection carried out sharply through subcutaneous tissue and fascia. The tip of the trocar was identified. A  hematoma was aspirated. Electrocautery was used for small bleeders. The guidepin was inserted into the tip of the trochanter, and a 17-mm drill used to open the proximal femur. The guidepin was then advanced down the shaft and a 125 degree x 11 mm rod was advanced under fluoroscopic control into good position. A second stab wound was made distally and the pin guide inserted. The guidepin was passed into the neck and head of the femur and good position on AP and lateral views after maneuvering it a little bit. The lateral cortex was drilled. The  guidepin was overdrilled with a step-cup reamer. An 85-mm spot helical blade was then inserted and seated fully. The set screw was tightened from the top. The distal screw aiming device was inserted and another stab wound made. The distal screw hole was drilled and filled with a 36-mm cortical screw. Fluoroscopy showed all hardware to be in good position and the fracture to be satisfactory. The outriggers were removed. The wounds were irrigated and the deep fascia closed with 0 Vicryl. Subcutaneous tissues were closed with 2-0 Vicryl. The skin was closed with staples. A dry sterile dressing was applied. The patient's leg was taken out of traction, had good range of motion without crepitus. She was transferred to her hospital bed and taken to Recovery in good condition.   ____________________________ Valinda HoarHoward E. Veena Sturgess, MD hem:jm D: 09/18/2013 14:45:56 ET T: 09/18/2013 16:50:45 ET JOB#: [id number]  cc: Valinda HoarHoward E. Graclyn Lawther, MD, <Dictator> Valinda HoarHOWARD E Yaritzy Huser MD ELECTRONICALLY SIGNED 09/20/2013 7:12

## 2014-09-04 NOTE — H&P (Signed)
PATIENT NAME:  LOUIS, GAW MR#:  [id number] DATE OF BIRTH:  1918-02-28  DATE OF ADMISSION:  09/18/2013  REFERRING PHYSICIAN:  Dr. Ladona Ridgel.  PRIMARY CARE PHYSICIAN:  Dr. Sherri Rad.   CHIEF COMPLAINT:  Hip pain.   HISTORY OF PRESENT ILLNESS:  A 81 year old Caucasian female with history of A. Fib, right DVT diagnosed September 2014 as well as baseline dementia presenting with hip pain.  At her nursing facility had suffered a mechanical fall when attempting to ambulate in the bathroom.  States that she hit her head, but no loss of consciousness, had immediate pain of left hip, sharp and nonradiating, worsened with movement.  No relieving factors.  During initial work-up found to have a comminuted left intertrochanteric fracture.  Currently complaining only of hip pain.   REVIEW OF SYSTEMS:  CONSTITUTIONAL:  Denies fever, fatigue, weakness.  EYES:  Denies blurred vision, double vision, eye pain.  EARS, NOSE, THROAT:  Denies tinnitus, ear pain, hearing loss.  RESPIRATORY:  Denies cough, wheeze, shortness of breath.  CARDIOVASCULAR:  Denies chest pain, palpitations, edema.  GASTROINTESTINAL:  Denies nausea, vomiting, diarrhea, abdominal pain.  GENITOURINARY:  Denies dysuria, hematuria.  ENDOCRINE:  Denies nocturia or thyroid problems.  HEMATOLOGY AND LYMPHATIC:  Denies easy bruising or bleeding.  SKIN:  Denies rash or lesions.  MUSCULOSKELETAL:  Denies pain in neck, back, shoulders, knees.  Positive for hip pain as described above.  NEUROLOGIC:  Denies paralysis, paresthesias.  PSYCHIATRIC:  Denies anxiety or depressive symptoms.  Otherwise, full review of systems performed by me is negative.   PAST MEDICAL HISTORY:  Hypertension, atrial fibrillation, hyperlipidemia, baseline dementia, coronary artery disease as well as DVT diagnosed September 2014, placed on aspirin and Plavix.   SOCIAL HISTORY:  Remote tobacco abuse.  Denies any current tobacco, alcohol or drug usage.  She uses a walker for  ambulation.   FAMILY HISTORY:  Positive for CVAs.   ALLERGIES:  LASIX, PENICILLIN.   HOME MEDICATIONS:  Include aspirin 81 mg by mouth daily, simvastatin 5 mg by mouth at bedtime, Plavix 75 mg by mouth daily, Actonel 35 mg by mouth q. weekly on Saturdays, albuterol 90 mcg inhalation 2 puffs every six hours as needed, rimantadine 150 mg by mouth daily, Azo cranberry 2 tabs by mouth daily, Feosol 325 mg by mouth at bedtime, Colace 100 mg by mouth twice daily as needed for constipation, oxybutynin 10 mg by mouth daily, calcium plus D 500/200 by mouth daily, cholecalciferol 1000 units by mouth daily.   PHYSICAL EXAMINATION: VITAL SIGNS:  Temperature 97.5, heart rate 87, respirations 22, blood pressure 161/79, saturating 96% on room air.  Weight 74.8 kg, BMI 28.3.  GENERAL:  Chronically ill-appearing Caucasian female, currently in no acute distress.  HEAD:  Normocephalic, atraumatic.  EYES:  Pupils equal, round, reactive to light.  Extraocular muscles intact.  No scleral icterus.  MOUTH:  Dry mucosal membrane.  Poor dentition.  No abscess noted.  EARS, NOSE, THROAT:  Throat clear without exudates.  No external lesions.  NECK:  Supple.  No thyromegaly.  No nodules.  No JVD.  PULMONARY:  Clear to auscultation bilaterally without wheezes, rubs or rhonchi.  No use of accessory muscles.  Good respiratory effort.  Chest nontender to palpation.  CARDIOVASCULAR:  S1, S2, irregular rate, irregular rhythm.  No murmurs, rubs, or gallops.  Trace pedal edema to ankles bilaterally.  Pedal pulses 2+.  GASTROINTESTINAL:  Soft, nontender, nondistended.  No masses.  Positive bowel sounds.  No hepatosplenomegaly.  MUSCULOSKELETAL:  No swelling, clubbing, or edema.  Range of motion limited in the left lower extremity secondary to pain.  NEUROLOGIC:  Cranial nerves II through XII intact.  No gross focal neurological deficits.  Sensation intact.  Reflexes intact.  SKIN:  No ulcerations, lesions, rash or cyanosis noted.   Skin warm, dry.  Turgor intact.  PSYCHIATRIC:  Mood and affect blunted.  She is awake, alert, oriented to person as well as place, however not time.  Insight and judgment appear to be intact.   LABORATORY DATA:  She had radiographic imaging revealing a comminuted left intertrochanteric fracture.  CT head performed revealing no acute process.  CT hip performed revealing comminuted left intertrochanteric fracture without dislocation.  Remainder of laboratory data:  Sodium 134, potassium 4.2, chloride 100, bicarb 26, BUN 21, creatinine 1.11, glucose 112, this is actually somewhat improved from baseline creatinine and BUN from prior admissions.  LFTs within normal limits.  WBC 10, hemoglobin 12, platelets of 171.  PTT 37.3.  INR 1.1.  Urinalysis negative for evidence of infection.  EKG revealed A. Fib, heart rate 100.   ASSESSMENT AND PLAN:  A 81 year old Caucasian female with history of atrial fibrillation, right deep vein thrombosis as well as baseline dementia presenting with hip pain.  1.  Preoperative evaluation for comminuted left intertrochanteric fracture.  Orthopedics has already evaluated the patient and planning for a possible percutaneous pinning.  We will provide pain control.  Add Colace.  We will referred venous thromboembolism prophylaxis to orthopedic surgery.  The patient should be considered a moderate risk for a low to moderate risk procedure from a cardiovascular standpoint.  No further testing is required prior to surgery.  As far as her medication, we will continue all of her home medications aside from aspirin and Plavix for now.  Once again these were started originally for deep vein thrombosis back in September 2014, suspect minimal bleeding loss for the planned procedure.  Her METs should be considered less than 4, limited by general range of motion and mobility, however she has no active congestive heart failure, valvular dysfunction or severe arrhythmias or further contraindications  for surgery.  2.  Atrial fibrillation, which appears to be relatively rate controlled at this time.  3.  Peripheral artery disease.  Continue with statin therapy.  4.  Venous thromboembolism prophylaxis with sequential compression devices.  5.  CODE STATUS:  SHE IS LISTED AS COMFORT MEASURES ONLY PER DOCUMENTATION BROUGHT HERE AS WELL AS THE DO NOT RESUSCITATE, CODE DO NOT RESUSCITATE.   TIME SPENT:  45 minutes.    ____________________________ Cletis Athensavid K. Hower, MD dkh:ea D: 09/18/2013 01:45:27 ET T: 09/18/2013 04:18:36 ET JOB#: [id number]  cc: Cletis Athensavid K. Hower, MD, <Dictator> DAVID Synetta ShadowK HOWER MD ELECTRONICALLY SIGNED 09/18/2013 20:29

## 2014-09-04 NOTE — Consult Note (Signed)
Brief Consult Note: Diagnosis: Left intertrochanteric hip fracture.   Patient was seen by consultant.   Recommend to proceed with surgery or procedure.   Recommend further assessment or treatment.   Orders entered.   Discussed with Attending MD.   Comments: 81 year old female fell at assisted living last night suffering an intertrochanteric fracture left hip.   Brought to Emergency Room where exam and X-rays show the above.  Admitted and cleared for surgery.Rochele Pages.Took Plavix yesterday. Plan Trochanteric Fixation Nail devise to stabilize through small incisions.  Risks and benefits of surgery were discussed at length including but not limited to infection, non union, nerve or blood vessed damage, non union, need for repeat surgery, blood clots and lung emboli, and death  with daughter-in-law and granddaughter.  Exam:  Alert, in pain.  circulation/sensation/motor function good left leg.  short and rotated.  skin intact.  Left hand ecchymotic but no fracture visualized.    X-rays:  as above  Imp:  displaced left intertrochanteric hip fracture.   Rx:  open reduction and internal fixation left hip with Trochanteric Fixation Nail.  Electronic Signatures: Valinda HoarMiller, Sander Remedios E (MD)  (Signed [phone]-May-15 12:42)  Authored: Brief Consult Note   Last Updated: 08-May-15 12:42 by Valinda HoarMiller, Latresha Yahr E (MD)

## 2014-09-04 NOTE — Discharge Summary (Signed)
PATIENT NAME:  Crystal [name], Crystal [name] MR#:  [id number] DATE OF BIRTH:  Dec 23, 1917  DATE OF ADMISSION:  09/18/2013 DATE OF DISCHARGE: 09/23/2013     PRIMARY CARE PHYSICIAN: Dr. Sherri Radrip.   CHIEF COMPLAINT: Head pain.   DISCHARGE DIAGNOSES:  1.  Fall, status post hip fracture.  2.  Hypoxia requiring transient oxygen.  3.  Acute kidney injury.  4.  Acute mild hyponatremia, improved.  5.  Acute blood loss anemia.  6.  Dementia.  7.  History of hypertension.  8.  History of deep vein thrombosis on aspirin and Plavix.  9.  Coronary artery disease.  10. Hyperlipidemia.  11. History of atrial of fibrillation.   DISCHARGE MEDICATIONS: Aspirin 81 mg daily, Plavix 75 mg daily, Actonel 35 mg once a week on Saturdays, Azo cranberry oral tablet take 2 tabs once a day, simvastatin 5 mg daily, multivitamin 1 tab daily, Oyst-Cal D 500/200, 1 tab once a day, ranitidine 150 mg once a day, Biotin mouth wash 30 mL 2 times a day, ferrous sulfate 325 mg at bedtime, albuterol 2 puffs every 6 inhaled aerosol as needed, docusate sodium 100 mg 2 times a day, hydralazine 50 mg 3 times a day, acetaminophen 650 mg every 4 hours as needed and acetaminophen/hydrocodone 325/5 mg 1 tab every 4 to 6 hours as needed for pain.   DIET: Low sodium.   ACTIVITY: As tolerated.   FOLLOWUP: Please follow with PCP and orthopedics within 1 to 2 weeks.   DISPOSITION: To rehab.   SIGNIFICANT LABS AND IMAGING: Initial white count of 10, initial hemoglobin of 12, lowest hemoglobin was 7.1 and last hemoglobin of 8.2. LFTs on arrival within normal limits. Initial BUN 21, creatinine 1.14, sodium 134, lowest sodium 129, last sodium 131, peak creatinine of 1.59, last creatinine of 1.33. CT head without contrast showing no acute intracranial injury. CT left hip without contrast: Comminuted left intertrochanteric fracture without dislocation. Left hand complete: No acute osseous abnormality. Chest x-ray, one view, on the day of admission no active  disease. Pelvis, AP only on May 08 x-ray showing comminuted left intertrochanteric fracture without dislocation.   HISTORY OF PRESENT ILLNESS AND HOSPITAL COURSE: For full details of H and P, please see the dictation on day of admission by Dr. Clint GuyHower, but briefly, this is a pleasant 81 year old with history of DVT in September 2014. She was started on Plavix and aspirin. She had a fall, mechanical in nature. The patient had no loss of consciousness, but had immediate pain in the left hip and came into the hospital and was noted to have a left hip fracture. She was admitted to the hospitalist service and orthopedics was consulted. She underwent hip surgery on May 08 with nail and pinning. The patient was followed by orthopedics. At this point, pain is significantly controlled. She has been seen by PT and she will be going to rehab. She did develop acute kidney injury postop, likely due to volume loss and anemia and is better currently with fluids. The patient also did have acute blood loss anemia and required 2 units of blood and at this time the hemoglobin is stabilized. She will be discharged on iron. The patient also did have transient hypoxemia, but I think that was more likely due to the patient feeling cold and not measuring appropriately. At this point, hypoxemia has resolved. She is not on oxygen. In regards to her A. fib, she is rate controlled. She is on statin, aspirin and Plavix for her peripheral  arterial disease. She did have mild acute hyponatremia postop and that has improved as well.   On the day of discharge, the patient is sitting on a chair. Temperature is 97.6, blood pressure 123/71, oxygen saturation 100% on room air. Generally, the patient is a well-developed female in no obvious distress. Poor dentition. Moist mucous membranes. Neck is supple. Normal S1, S2.  Lungs show mild basilar crackles on the right base. No pitting edema. The patient has some ecchymosis on the left hand. The patient  has bandages on the left hip.   TOTAL TIME SPENT: 33 minutes.   CODE STATUS: The patient is DNR.  ____________________________ Krystal Eaton, MD sa:aw D: 09/23/2013 11:10:46 ET T: 09/23/2013 11:27:19 ET JOB#: [id number]  cc: Krystal Eaton, MD, <Dictator> Valinda Hoar, MD DR. TRIP Krystal Eaton MD ELECTRONICALLY SIGNED 10/22/2013 10:48

## 2014-10-13 ENCOUNTER — Encounter
Admission: RE | Admit: 2014-10-13 | Discharge: 2014-10-13 | Disposition: A | Discharge status: Home / Self Care | Payer: Medicare Other | Source: Ambulatory Visit | Attending: Internal Medicine | Admitting: Internal Medicine

## 2014-10-19 IMAGING — CT CT HEAD WITHOUT CONTRAST
4 series · 18 of 30 positions shown, 19 images · non-contrast
Comparison: 02/01/2013

CLINICAL DATA: Fall with head trauma

EXAM:
CT HEAD WITHOUT CONTRAST
TECHNIQUE: Contiguous axial images were obtained from the base of the skull
through the vertex without intravenous contrast.

[Series 2: head bone · axial · 0.41mm/px · z∈[+1192,+1330]mm · 8 of 85 slices shown]
[im 8/85  bone]
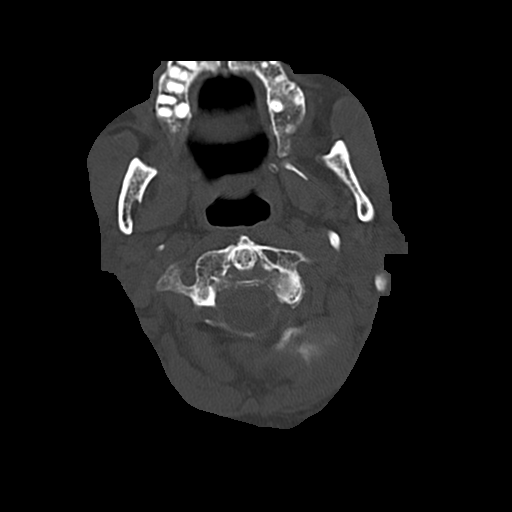
[im 16/85  bone]
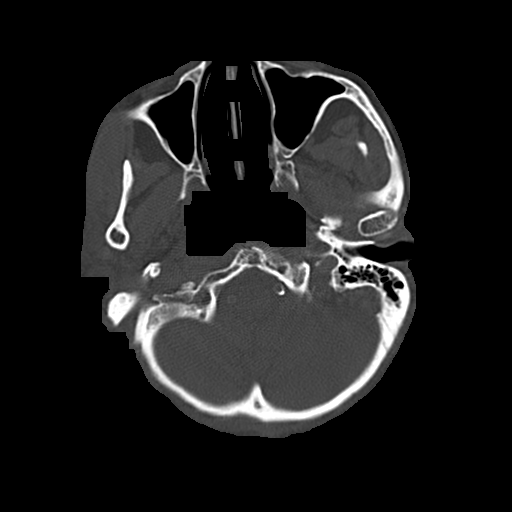
[im 31/85  bone]
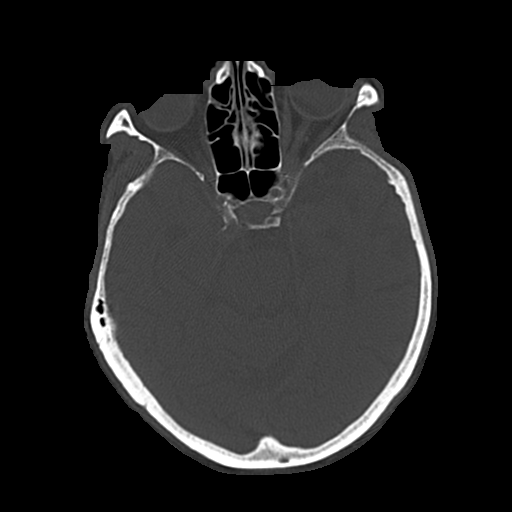
[im 39/85  bone]
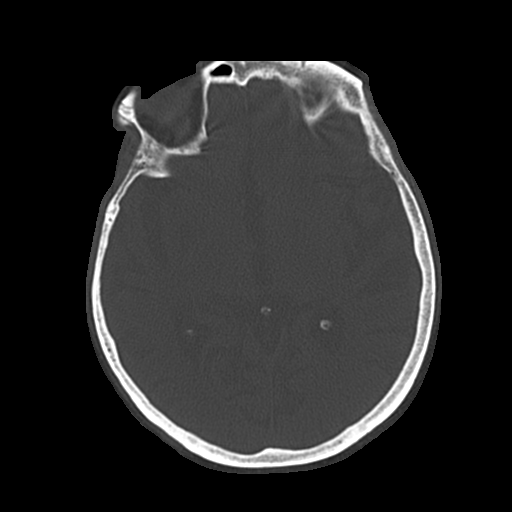
[im 46/85  bone]
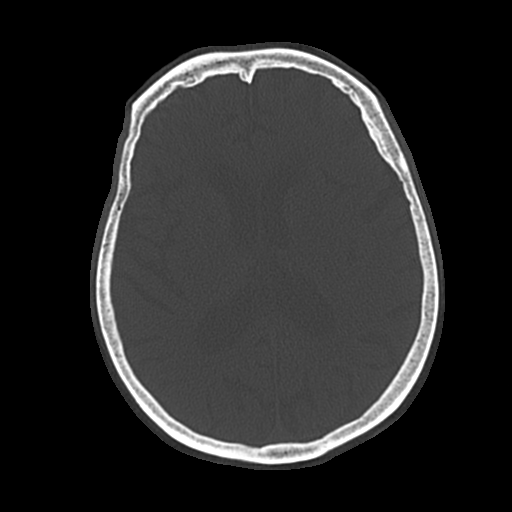
[im 54/85  bone]
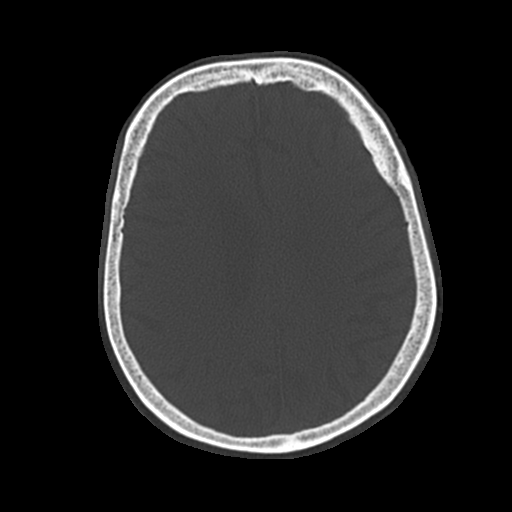
[im 69/85  bone]
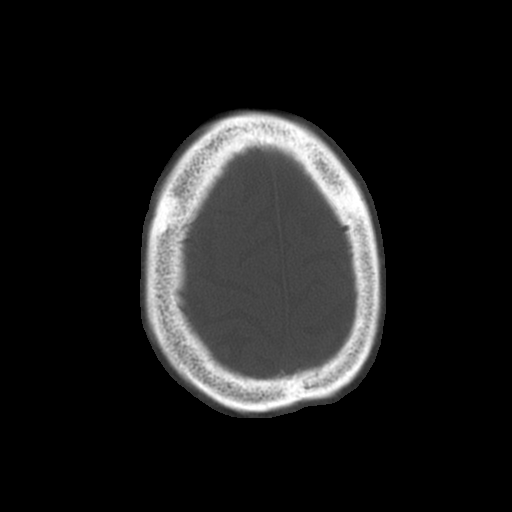
[im 77/85  bone]
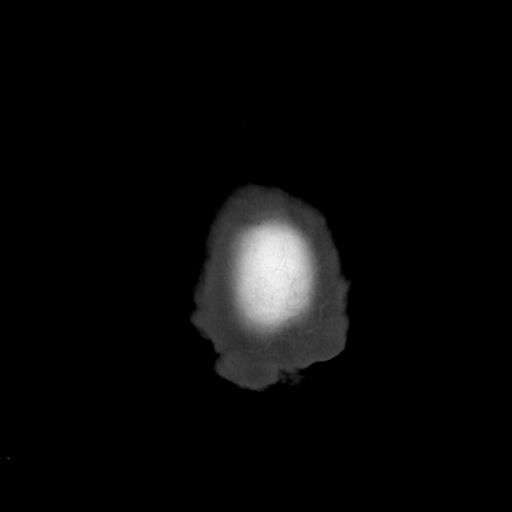

[Series 3: head wo · axial · 0.41mm/px · z∈[+1238,+1288]mm · 2 of 31 slices shown, 3 images]
[im 11/31  brain]
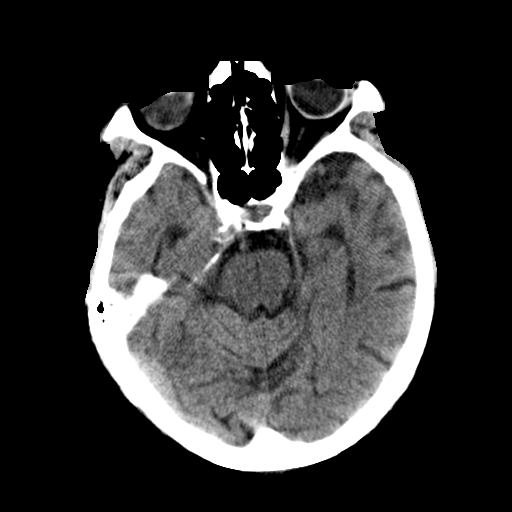
[im 11/31  bone]
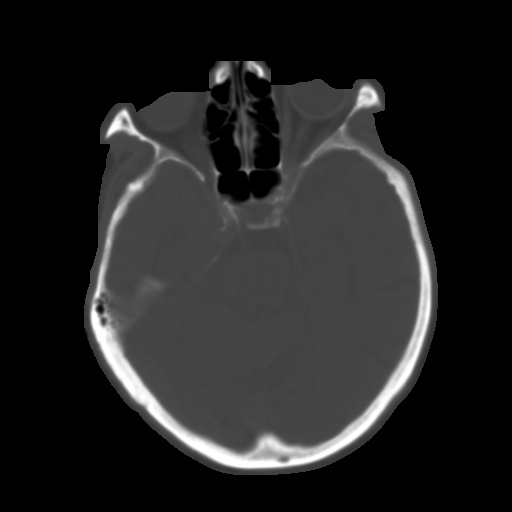
[im 21/31  brain]
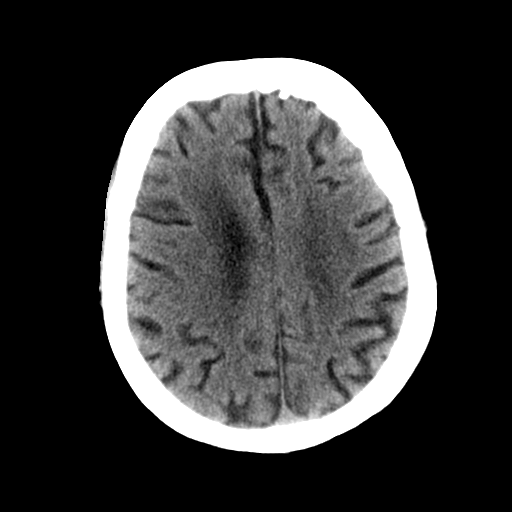

[Series 4: head wo recon · axial · 0.41mm/px · z∈[+1310,+1346]mm · 2 of 25 slices shown]
[im 9/25  brain]
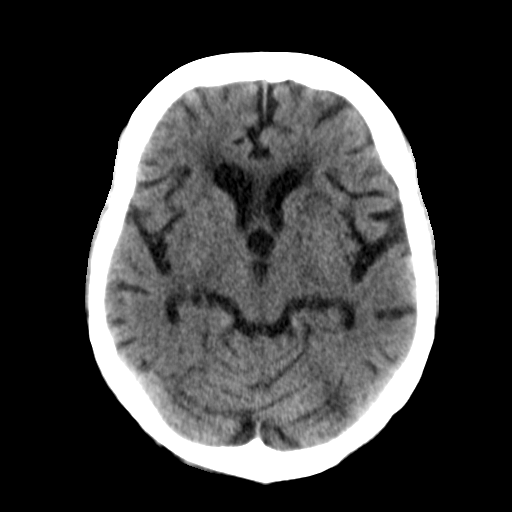
[im 17/25  brain]
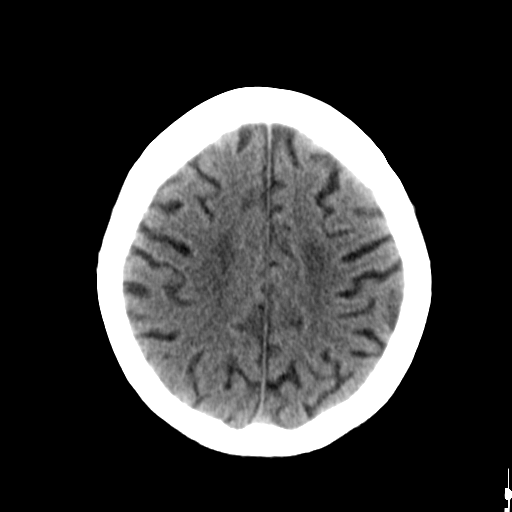

[Series 5: head bone recon · axial · 0.41mm/px · z∈[+1288,+1354]mm · 6 of 68 slices shown]
[im 8/68  bone]
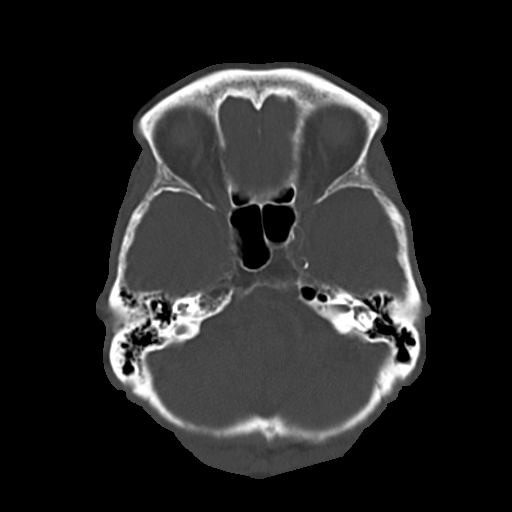
[im 15/68  bone]
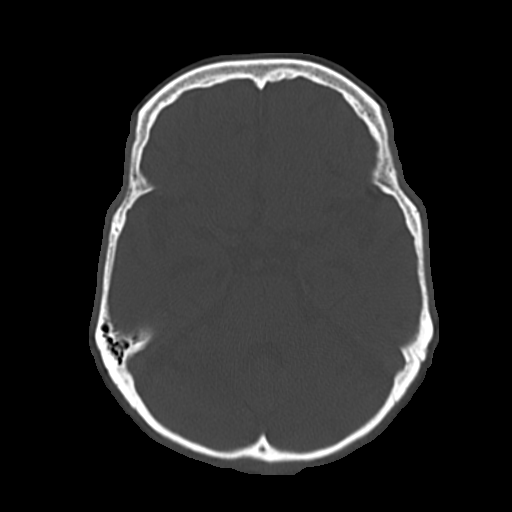
[im 23/68  bone]
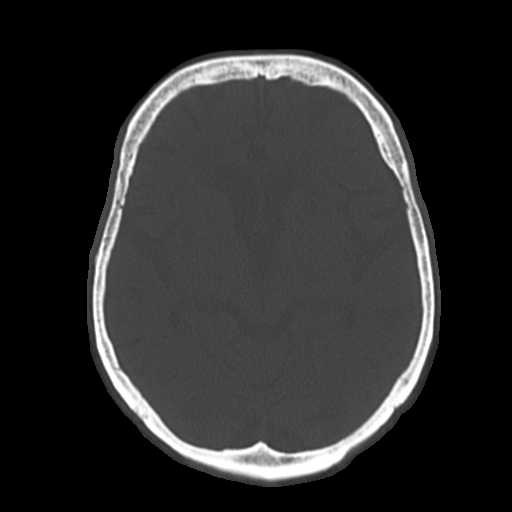
[im 30/68  bone]
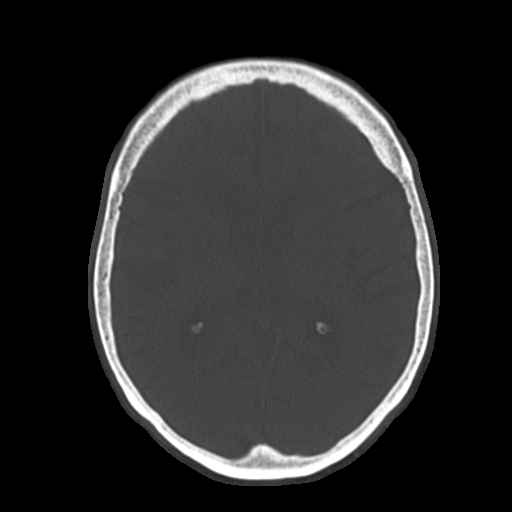
[im 38/68  bone]
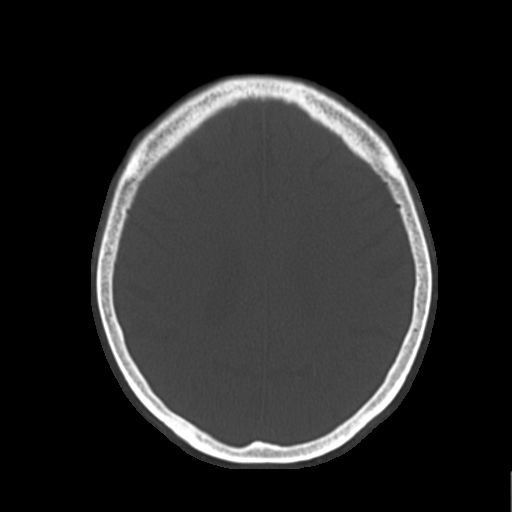
[im 45/68  bone]
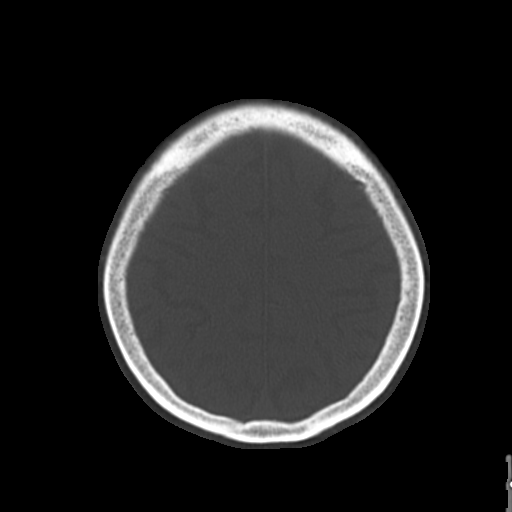

[18 of 30 positions shown; findings below may reference images not displayed]

FINDINGS: Skull and Sinuses:No significant abnormality.

Orbits: No acute abnormality.

Brain: No evidence of acute abnormality, such as acute infarction,
hemorrhage, hydrocephalus, or mass lesion/mass effect.

Generalized cerebral volume loss, age commensurate. There is chronic
small vessel disease with ischemic gliosis around the lateral
ventricles. Stable indistinct appearance of the putamen related to
chronic small vessel disease or prominent perivascular spaces.
IMPRESSION: No acute intracranial injury.

## 2014-10-19 IMAGING — CR DG CHEST 1V PORT
1 series · 1 of 1 positions shown · non-contrast
Comparison: 02/01/2013

CLINICAL DATA: Fall with pain.

EXAM:
PORTABLE CHEST - 1 VIEW

[t chest supine]
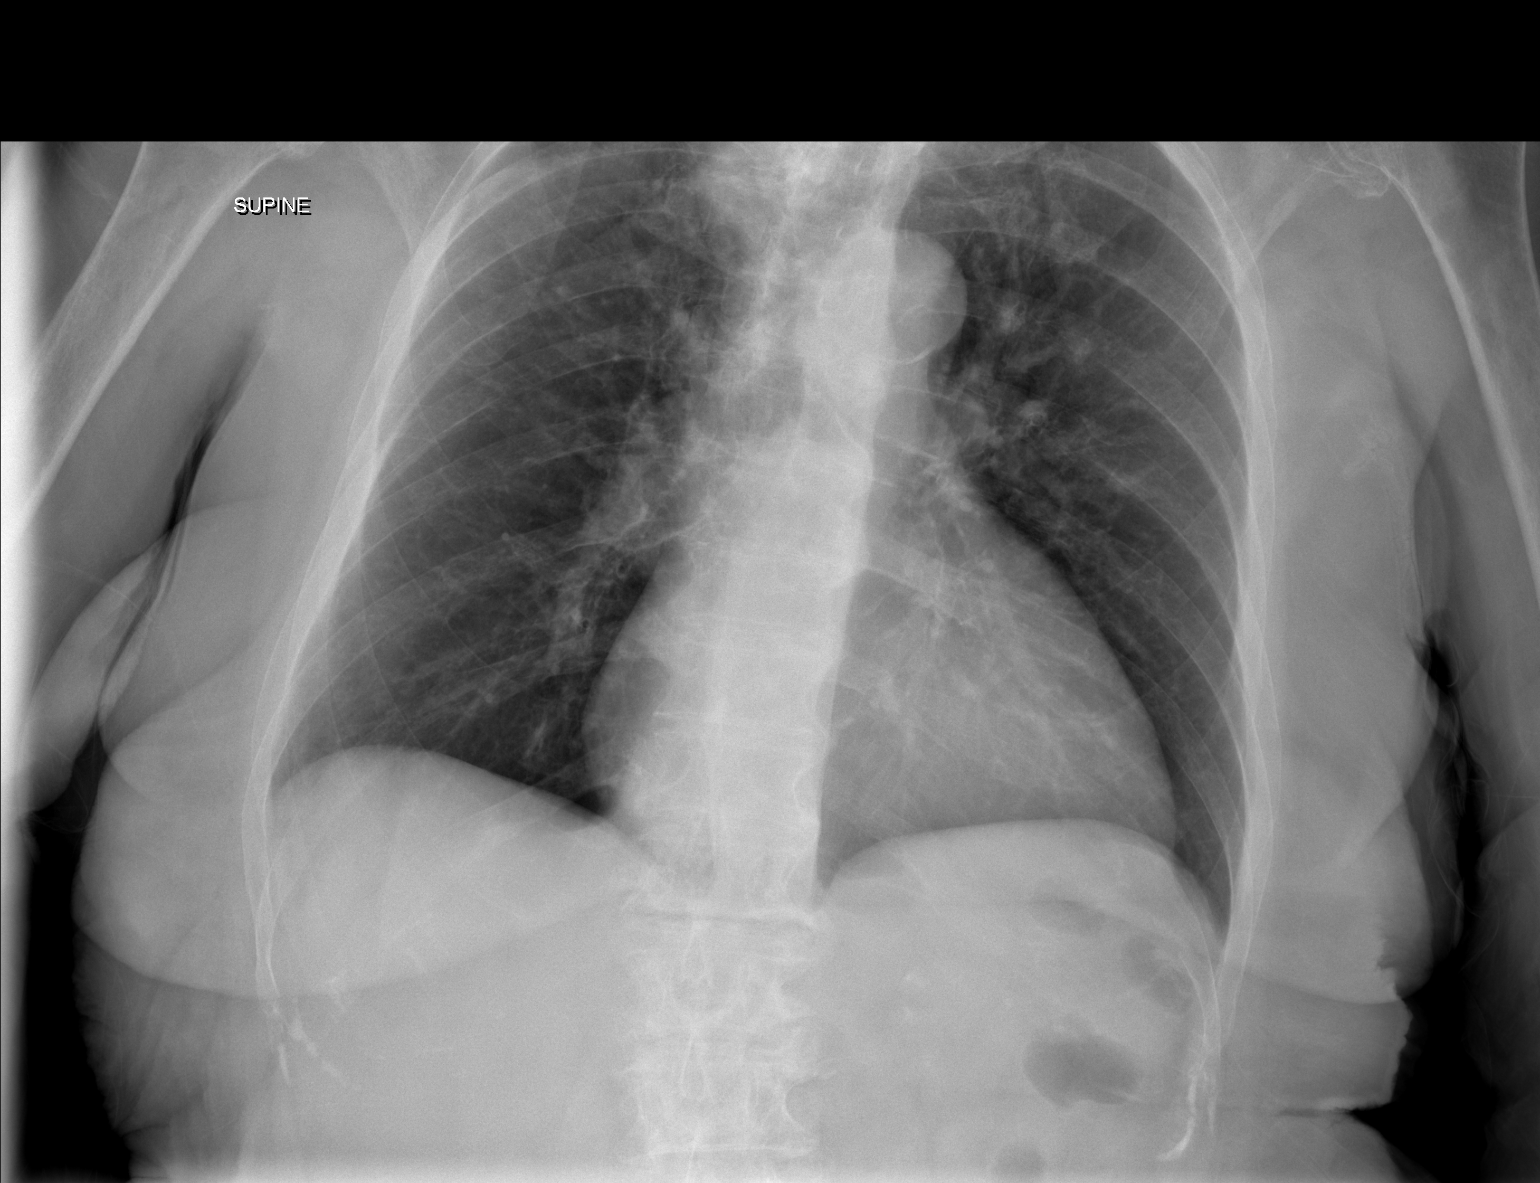

[1 of 1 positions shown; findings below may reference images not displayed]

FINDINGS: Mild cardiomegaly. There is convexity of the upper right mediastinal
contour which is stable from 3644, associated with coarse
calcification. Findings likely represent ectasia with
atherosclerosis. No contusion, hemothorax, or pneumothorax. No
edema. No visible fracture.
IMPRESSION: No active disease.

## 2014-11-12 ENCOUNTER — Encounter
Admission: RE | Admit: 2014-11-12 | Discharge: 2014-11-12 | Disposition: A | Discharge status: Home / Self Care | Payer: Medicare Other | Source: Ambulatory Visit | Attending: Internal Medicine | Admitting: Internal Medicine

## 2014-11-12 DIAGNOSIS — I10 Essential (primary) hypertension: Secondary | ICD-10-CM | POA: Insufficient documentation

## 2014-11-12 DIAGNOSIS — D649 Anemia, unspecified: Secondary | ICD-10-CM | POA: Insufficient documentation

## 2014-11-30 DIAGNOSIS — I10 Essential (primary) hypertension: Secondary | ICD-10-CM | POA: Diagnosis not present

## 2014-11-30 DIAGNOSIS — D649 Anemia, unspecified: Secondary | ICD-10-CM | POA: Diagnosis present

## 2014-11-30 LAB — BASIC METABOLIC PANEL
ANION GAP: 9 (ref 5–15)
BUN: 30 mg/dL — AB (ref 6–20)
CHLORIDE: 112 mmol/L — AB (ref 101–111)
CO2: 23 mmol/L (ref 22–32)
Calcium: 9.2 mg/dL (ref 8.9–10.3)
Creatinine, Ser: 0.85 mg/dL (ref 0.44–1.00)
GFR calc Af Amer: >60 mL/min (ref >60)
GFR calc non Af Amer: 56 mL/min — ABNORMAL LOW (ref >60)
Glucose, Bld: 126 mg/dL — ABNORMAL HIGH (ref 65–99)
POTASSIUM: 4 mmol/L (ref 3.5–5.1)
Sodium: 144 mmol/L (ref 135–145)

## 2014-11-30 LAB — CBC WITH DIFFERENTIAL/PLATELET
Basophils Absolute: 0 10*3/uL (ref 0–0.1)
Basophils Relative: 0 %
EOS PCT: 2 %
Eosinophils Absolute: 0.2 10*3/uL (ref 0–0.7)
HCT: 32.2 % — ABNORMAL LOW (ref 35.0–47.0)
Hemoglobin: 10.8 g/dL — ABNORMAL LOW (ref 12.0–16.0)
LYMPHS ABS: 1.7 10*3/uL (ref 1.0–3.6)
Lymphocytes Relative: 17 %
MCH: 31.6 pg (ref 26.0–34.0)
MCHC: 33.6 g/dL (ref 32.0–36.0)
MCV: 94.3 fL (ref 80.0–100.0)
Monocytes Absolute: 0.7 10*3/uL (ref 0.2–0.9)
Monocytes Relative: 7 %
NEUTROS PCT: 74 %
Neutro Abs: 7.5 10*3/uL — ABNORMAL HIGH (ref 1.4–6.5)
Platelets: 211 10*3/uL (ref 150–440)
RBC: 3.41 MIL/uL — ABNORMAL LOW (ref 3.80–5.20)
RDW: 13.3 % (ref 11.5–14.5)
WBC: 10.1 10*3/uL (ref 3.6–11.0)

## 2014-12-13 ENCOUNTER — Encounter
Admission: RE | Admit: 2014-12-13 | Discharge: 2014-12-13 | Disposition: A | Discharge status: Home / Self Care | Payer: Medicare Other | Source: Ambulatory Visit | Attending: Internal Medicine | Admitting: Internal Medicine

## 2015-01-13 ENCOUNTER — Encounter
Admission: RE | Admit: 2015-01-13 | Discharge: 2015-01-13 | Disposition: A | Discharge status: Home / Self Care | Payer: Medicare Other | Source: Ambulatory Visit | Attending: Internal Medicine | Admitting: Internal Medicine

## 2015-02-12 ENCOUNTER — Encounter
Admission: RE | Admit: 2015-02-12 | Discharge: 2015-02-12 | Disposition: A | Discharge status: Home / Self Care | Payer: Medicare Other | Source: Ambulatory Visit | Attending: Internal Medicine | Admitting: Internal Medicine

## 2015-03-15 ENCOUNTER — Encounter
Admission: RE | Admit: 2015-03-15 | Discharge: 2015-03-15 | Disposition: A | Discharge status: Home / Self Care | Payer: Medicare Other | Source: Ambulatory Visit | Attending: Internal Medicine | Admitting: Internal Medicine

## 2015-04-14 ENCOUNTER — Encounter
Admission: RE | Admit: 2015-04-14 | Discharge: 2015-04-14 | Disposition: A | Discharge status: Home / Self Care | Payer: Medicare Other | Source: Ambulatory Visit | Attending: Internal Medicine | Admitting: Internal Medicine

## 2015-05-15 ENCOUNTER — Encounter
Admission: RE | Admit: 2015-05-15 | Discharge: 2015-05-15 | Disposition: A | Discharge status: Home / Self Care | Payer: Medicare Other | Source: Ambulatory Visit | Attending: Internal Medicine | Admitting: Internal Medicine

## 2015-06-15 ENCOUNTER — Encounter
Admission: RE | Admit: 2015-06-15 | Discharge: 2015-06-15 | Disposition: A | Discharge status: Home / Self Care | Payer: Medicare Other | Source: Ambulatory Visit | Attending: Internal Medicine | Admitting: Internal Medicine

## 2015-06-15 DIAGNOSIS — I10 Essential (primary) hypertension: Secondary | ICD-10-CM | POA: Insufficient documentation

## 2015-06-15 DIAGNOSIS — D649 Anemia, unspecified: Secondary | ICD-10-CM | POA: Insufficient documentation

## 2015-07-05 DIAGNOSIS — I10 Essential (primary) hypertension: Secondary | ICD-10-CM | POA: Diagnosis present

## 2015-07-05 DIAGNOSIS — D649 Anemia, unspecified: Secondary | ICD-10-CM | POA: Diagnosis not present

## 2015-07-05 LAB — CBC WITH DIFFERENTIAL/PLATELET
BASOS ABS: 0 10*3/uL (ref 0–0.1)
BASOS PCT: 0 %
Eosinophils Absolute: 0.6 10*3/uL (ref 0–0.7)
Eosinophils Relative: 7 %
HCT: 40.3 % (ref 35.0–47.0)
Hemoglobin: 13.5 g/dL (ref 12.0–16.0)
Lymphocytes Relative: 28 %
Lymphs Abs: 2.6 10*3/uL (ref 1.0–3.6)
MCH: 32.7 pg (ref 26.0–34.0)
MCHC: 33.4 g/dL (ref 32.0–36.0)
MCV: 97.8 fL (ref 80.0–100.0)
Monocytes Absolute: 0.6 10*3/uL (ref 0.2–0.9)
Monocytes Relative: 7 %
Neutro Abs: 5.4 10*3/uL (ref 1.4–6.5)
Neutrophils Relative %: 58 %
Platelets: 217 10*3/uL (ref 150–440)
RBC: 4.12 MIL/uL (ref 3.80–5.20)
RDW: 13.5 % (ref 11.5–14.5)
WBC: 9.3 10*3/uL (ref 3.6–11.0)

## 2015-07-05 LAB — BASIC METABOLIC PANEL
ANION GAP: 8 (ref 5–15)
BUN: 36 mg/dL — ABNORMAL HIGH (ref 6–20)
CALCIUM: 9.7 mg/dL (ref 8.9–10.3)
CO2: 26 mmol/L (ref 22–32)
Chloride: 106 mmol/L (ref 101–111)
Creatinine, Ser: 1.15 mg/dL — ABNORMAL HIGH (ref 0.44–1.00)
GFR calc non Af Amer: 39 mL/min — ABNORMAL LOW (ref >60)
GFR, EST AFRICAN AMERICAN: 45 mL/min — AB (ref >60)
Glucose, Bld: 109 mg/dL — ABNORMAL HIGH (ref 65–99)
Potassium: 5 mmol/L (ref 3.5–5.1)
SODIUM: 140 mmol/L (ref 135–145)

## 2015-07-13 ENCOUNTER — Encounter
Admission: RE | Admit: 2015-07-13 | Discharge: 2015-07-13 | Disposition: A | Discharge status: Home / Self Care | Payer: Medicare Other | Source: Ambulatory Visit | Attending: Internal Medicine | Admitting: Internal Medicine

## 2015-08-13 ENCOUNTER — Encounter
Admission: RE | Admit: 2015-08-13 | Discharge: 2015-08-13 | Disposition: A | Discharge status: Home / Self Care | Payer: Medicare Other | Source: Ambulatory Visit | Attending: Internal Medicine | Admitting: Internal Medicine

## 2015-09-12 ENCOUNTER — Encounter
Admission: RE | Admit: 2015-09-12 | Discharge: 2015-09-12 | Disposition: A | Discharge status: Home / Self Care | Payer: Medicare Other | Source: Ambulatory Visit | Attending: Internal Medicine | Admitting: Internal Medicine

## 2015-10-13 ENCOUNTER — Encounter
Admission: RE | Admit: 2015-10-13 | Discharge: 2015-10-13 | Disposition: A | Discharge status: Home / Self Care | Payer: Medicare Other | Source: Ambulatory Visit | Attending: Internal Medicine | Admitting: Internal Medicine

## 2015-10-13 ENCOUNTER — Non-Acute Institutional Stay (SKILLED_NURSING_FACILITY): Payer: Medicare Other | Admitting: Gerontology

## 2015-10-13 DIAGNOSIS — R05 Cough: Secondary | ICD-10-CM | POA: Insufficient documentation

## 2015-10-13 DIAGNOSIS — D649 Anemia, unspecified: Secondary | ICD-10-CM | POA: Insufficient documentation

## 2015-10-13 DIAGNOSIS — R058 Other specified cough: Secondary | ICD-10-CM

## 2015-10-14 DIAGNOSIS — R05 Cough: Secondary | ICD-10-CM | POA: Diagnosis present

## 2015-10-14 DIAGNOSIS — D649 Anemia, unspecified: Secondary | ICD-10-CM | POA: Diagnosis not present

## 2015-10-14 LAB — COMPREHENSIVE METABOLIC PANEL
ALBUMIN: 4 g/dL (ref 3.5–5.0)
ALT: 10 U/L — AB (ref 14–54)
AST: 31 U/L (ref 15–41)
Alkaline Phosphatase: 76 U/L (ref 38–126)
Anion gap: 14 (ref 5–15)
BILIRUBIN TOTAL: 0.2 mg/dL — AB (ref 0.3–1.2)
BUN: 36 mg/dL — AB (ref 6–20)
CO2: 17 mmol/L — ABNORMAL LOW (ref 22–32)
CREATININE: 1.05 mg/dL — AB (ref 0.44–1.00)
Calcium: 9.8 mg/dL (ref 8.9–10.3)
Chloride: 106 mmol/L (ref 101–111)
GFR calc Af Amer: 50 mL/min — ABNORMAL LOW (ref >60)
GFR calc non Af Amer: 43 mL/min — ABNORMAL LOW (ref >60)
GLUCOSE: 99 mg/dL (ref 65–99)
POTASSIUM: 4.4 mmol/L (ref 3.5–5.1)
Sodium: 137 mmol/L (ref 135–145)
TOTAL PROTEIN: 7.9 g/dL (ref 6.5–8.1)

## 2015-10-14 LAB — CBC WITH DIFFERENTIAL/PLATELET
Basophils Absolute: 0.2 10*3/uL — ABNORMAL HIGH (ref 0–0.1)
Eosinophils Absolute: 0.5 10*3/uL (ref 0–0.7)
Eosinophils Relative: 5 %
HCT: 42.6 % (ref 35.0–47.0)
HEMOGLOBIN: 14.1 g/dL (ref 12.0–16.0)
LYMPHS ABS: 1.3 10*3/uL (ref 1.0–3.6)
Lymphocytes Relative: 12 %
MCH: 32.1 pg (ref 26.0–34.0)
MCHC: 33 g/dL (ref 32.0–36.0)
MCV: 97.4 fL (ref 80.0–100.0)
Monocytes Absolute: 0.6 10*3/uL (ref 0.2–0.9)
Monocytes Relative: 5 %
NEUTROS ABS: 8.6 10*3/uL — AB (ref 1.4–6.5)
Neutrophils Relative %: 76 %
Platelets: 294 10*3/uL (ref 150–440)
RBC: 4.37 MIL/uL (ref 3.80–5.20)
RDW: 13.2 % (ref 11.5–14.5)
WBC: 11.2 10*3/uL — ABNORMAL HIGH (ref 3.6–11.0)

## 2015-10-23 ENCOUNTER — Encounter: Payer: Self-pay | Admitting: Gerontology

## 2015-10-25 DIAGNOSIS — R058 Other specified cough: Secondary | ICD-10-CM | POA: Insufficient documentation

## 2015-10-25 DIAGNOSIS — R05 Cough: Secondary | ICD-10-CM | POA: Insufficient documentation

## 2015-10-25 NOTE — Progress Notes (Signed)
Location:   [facility] Service:  SNF [phone]) Provider:  Toni Arthurs, NP  primary care provider: Dr Frazier Richards.  No care team member to display  Extended Emergency Contact Information Primary Emergency Contact: Colt,BRENDA F Address: [address]          [place], [place] [address] Home Phone: [phone] Relation: None  Code Status: DNR Goals of care: Advanced Directive information No flowsheet data found.  chief complaint.  Cough  HPI:   Pt is a 82 y.o. female seen today for an acute visit for 3+ week history of cough. Pt has had this cough off and on and was most recently treated 2 weeks ago with a Z-pack. Cough improved some, but not completely. Friends and staff report "wet" sounding voice and cough. Friend reports she "just don't look and act the same." Staff has been giving her Guaifenesin with some relief of sx. Res denies any other sx. VSS  Past medical history of: Dementia without behavioral disturbance, Unspecified Atrial Fibrillation, Atherosclerostic Heart Disease of Native Coronary Artery without Angina Pectoris, Essential HTN, PVD, DVT/PE, OP without pathological fracture, hyperlipidemia, anemia, h/o falling, cardiomyopathy, O2 dependence    No past surgical history on file   Allergies  Allergen Reactions  . Lasix [Furosemide]   . Penicillins       Medication List    Notice  As of 10/13/2015 11:59 PM   See med list from [facility]      Review of Systems  Constitutional: Positive for appetite change (x 3 days) and fatigue.  HENT: Negative for sinus pressure, sneezing, sore throat and trouble swallowing.   Eyes: Negative.   Respiratory: Positive for cough. Negative for chest tightness, shortness of breath and wheezing.   Cardiovascular: Negative.   Gastrointestinal: Negative.   Endocrine: Negative.   Genitourinary: Negative.   Musculoskeletal: Negative.   Skin: Positive for pallor.  Allergic/Immunologic: Negative.     Neurological: Positive for weakness. Negative for dizziness, light-headedness and headaches.  Hematological: Negative.  Negative for adenopathy.  Psychiatric/Behavioral: Negative.      There is no immunization history on file for this patient. There are no preventive care reminders to display for this patient. No flowsheet data found. Functional Status Survey:    Filed Vitals:   10/13/15 1130  BP: 116/69  Pulse: 78  Temp: 97.5 F (36.4 C)  Resp: 24  SpO2: 98%   There is no height or weight on file to calculate BMI. Physical Exam  Constitutional: She is oriented to person, place, and time. She appears cachectic. She is active and cooperative.  HENT:  Right Ear: Decreased hearing is noted.  Left Ear: Decreased hearing is noted.  Mouth/Throat: Mucous membranes are normal.  Eyes: Conjunctivae and EOM are normal. Pupils are equal, round, and reactive to light.  Neck: No JVD present.  Cardiovascular: S1 normal, S2 normal and normal pulses.  A regularly irregular rhythm present. Exam reveals no gallop and no friction rub.   No murmur heard. Pulmonary/Chest: Effort normal. No accessory muscle usage. No respiratory distress. She has no wheezes. She has no rhonchi. She has rales in the right lower field and the left lower field.  Neurological: She is alert and oriented to person, place, and time.  Skin: Skin is warm, dry and intact. No cyanosis. Nails show no clubbing.    Labs reviewed:  Recent Labs  11/30/14 1107 07/05/15 0936 10/14/15 1430  NA 144 140 137  K 4.0 5.0  4.4  CL 112* 106 106  CO2 23 26 17*  GLUCOSE 126* 109* 99  BUN 30* 36* 36*  CREATININE 0.85 1.15* 1.05*  CALCIUM 9.2 9.7 9.8    Recent Labs  10/14/15 1430  AST 31  ALT 10*  ALKPHOS 76  BILITOT 0.2*  PROT 7.9  ALBUMIN 4.0    Recent Labs  11/30/14 1107 07/05/15 0936 10/14/15 1430  WBC 10.1 9.3 11.2*  NEUTROABS 7.5* 5.4 8.6*  HGB 10.8* 13.5 14.1  HCT 32.2* 40.3 42.6  MCV 94.3 97.8 97.4   PLT 211 217 294   No results found for: TSH No results found for: HGBA1C No results found for: CHOL, HDL, LDLCALC, LDLDIRECT, TRIG, CHOLHDL  Significant Diagnostic Results in last 30 days:  No results found.  Assessment/Plan Recurrent non-productive cough R05  Labs as listed above  Continue guaifenesin  CXR  Duonebs scheduled TID x 2 days, then Q 6 prn  Update Palliative Care NP  Family/ staff Communication: Updated staff on POC  Labs/tests ordered:  CBC, Met C, CXR

## 2015-11-09 NOTE — Progress Notes (Signed)
This encounter was created in error - please disregard.

## 2015-11-12 ENCOUNTER — Encounter
Admission: RE | Admit: 2015-11-12 | Discharge: 2015-11-12 | Disposition: A | Discharge status: Home / Self Care | Payer: Medicare Other | Source: Ambulatory Visit | Attending: Internal Medicine | Admitting: Internal Medicine

## 2015-12-07 ENCOUNTER — Non-Acute Institutional Stay (SKILLED_NURSING_FACILITY): Payer: Medicare Other | Admitting: Gerontology

## 2015-12-07 DIAGNOSIS — I10 Essential (primary) hypertension: Secondary | ICD-10-CM

## 2015-12-07 DIAGNOSIS — I482 Chronic atrial fibrillation, unspecified: Secondary | ICD-10-CM

## 2015-12-07 DIAGNOSIS — I4891 Unspecified atrial fibrillation: Secondary | ICD-10-CM | POA: Insufficient documentation

## 2015-12-07 NOTE — Progress Notes (Signed)
Location:  [facility] Service:  SNF [phone]) Provider:  Lorenso Quarry, NP-C  No primary care provider on file.  No care team member to display  Extended Emergency Contact Information Primary Emergency Contact: Crystal [name] Address: [address]          [place], [place] [address] Home Phone: [phone] Relation: None  Code Status:  DNR Goals of care: Advanced Directive information No flowsheet data found.   Chief Complaint  Patient presents with  . Medical Management of Chronic Issues    HPI:  Pt is a 82 y.o. female seen today for medical management of chronic diseases. She has a h/o Chronic Atrial Fibrillation and Benign Essential Hypertension. Rate and pressure remain well controlled. Pt is content, on her way to activities. Pt does c/o chronic left hip pain. Otherwise, doing well with no complaints.      No past medical history on file. No past surgical history on file.  Allergies  Allergen Reactions  . Lasix [Furosemide]   . Penicillins       Medication List    as of 12/07/2015 10:46 PM   You have not been prescribed any medications.     Review of Systems  Unable to perform ROS: Dementia  Constitutional: Negative for activity change, appetite change, chills, diaphoresis and fever.  HENT: Negative for congestion, sneezing, sore throat, trouble swallowing and voice change.   Eyes: Negative for pain, redness and visual disturbance.  Respiratory: Negative for apnea, cough, choking, chest tightness, shortness of breath and wheezing.   Cardiovascular: Negative for chest pain, palpitations and leg swelling.  Gastrointestinal: Negative for abdominal distention, abdominal pain, constipation, diarrhea and nausea.  Genitourinary: Negative for difficulty urinating, dysuria, frequency and urgency.  Musculoskeletal: Negative for back pain, gait problem and myalgias. Arthralgias: typical arthritis.  Skin: Negative for color change, pallor,  rash and wound.  Neurological: Negative for dizziness, tremors, syncope, speech difficulty, weakness, numbness and headaches.  Psychiatric/Behavioral: Negative for agitation and behavioral problems.  All other systems reviewed and are negative.    There is no immunization history on file for this patient. Pertinent  Health Maintenance Due  Topic Date Due  . DEXA SCAN  08/31/1984  . PNA vac Low Risk Adult (1 of 2 - PCV13) 08/31/1984  . INFLUENZA VACCINE  12/13/2015   No flowsheet data found. Functional Status Survey:    Vitals:   12/07/15 2242  BP: 135/60  Pulse: 80  Resp: 18  Temp: (!) 96.9 [name] (36.1 C)  SpO2: 100%  Weight: 129 lb 8 oz (58.7 kg)   There is no height or weight on file to calculate BMI. Physical Exam  Constitutional: Vital signs are normal. She appears well-developed and well-nourished. She is active and cooperative. She does not appear ill. No distress.  HENT:  Head: Normocephalic and atraumatic.  Mouth/Throat: Uvula is midline, oropharynx is clear and moist and mucous membranes are normal. Mucous membranes are not pale, not dry and not cyanotic.  Eyes: Conjunctivae, EOM and lids are normal. Pupils are equal, round, and reactive to light.  Neck: Trachea normal, normal range of motion and full passive range of motion without pain. Neck supple. No JVD present. No tracheal deviation, no edema and no erythema present. No thyromegaly present.  Cardiovascular: Normal rate, normal heart sounds, intact distal pulses and normal pulses.  An irregular rhythm present. Exam reveals no gallop and no distant heart sounds.   Pulmonary/Chest: Effort normal and breath sounds normal.  No accessory muscle usage. No respiratory distress. She has no wheezes. She exhibits no tenderness.  Abdominal: Normal appearance and bowel sounds are normal. She exhibits no distension and no ascites. There is no tenderness.  Musculoskeletal: She exhibits no edema.       Left hip: She exhibits  decreased range of motion, decreased strength and tenderness.  Expected osteoarthritis, stiffness  Neurological: She is alert. She has normal strength. Disoriented: memory deficits at baseline.  Skin: Skin is warm, dry and intact. She is not diaphoretic. No cyanosis. No pallor. Nails show no clubbing.  Psychiatric: She has a normal mood and affect. Her speech is normal and behavior is normal. Judgment and thought content normal. Cognition and memory are normal.  Nursing note and vitals reviewed.   Labs reviewed:  Recent Labs  07/05/15 0936 10/14/15 1430  NA 140 137  K 5.0 4.4  CL 106 106  CO2 26 17*  GLUCOSE 109* 99  BUN 36* 36*  CREATININE 1.15* 1.05*  CALCIUM 9.7 9.8    Recent Labs  10/14/15 1430  AST 31  ALT 10*  ALKPHOS 76  BILITOT 0.2*  PROT 7.9  ALBUMIN 4.0    Recent Labs  07/05/15 0936 10/14/15 1430  WBC 9.3 11.2*  NEUTROABS 5.4 8.6*  HGB 13.5 14.1  HCT 40.3 42.6  MCV 97.8 97.4  PLT 217 294   No results found for: TSH No results found for: HGBA1C No results found for: CHOL, HDL, LDLCALC, LDLDIRECT, TRIG, CHOLHDL  Significant Diagnostic Results in last 30 days:  No results found.  Assessment/Plan 1. Chronic atrial fibrillation (HCC) Pt is on ASA 81 mg daily for anticoagulation. Continue to monitor for tachycardia and rhythm irregularities. Rate is well controlled on current regimen. No adverse effects noted.  2. Essential hypertension, benign Blood pressure is well controlled on current regimen. Continue current regimen, healthy diet and pain control. No adverse effects noted.    Family/ staff Communication:   Total Time: 25 minutes  Documentation: 15 minutes  Face to Face: 10 minutes  Family/Phone: staff   Labs/tests ordered:  none   Brynda Rim, NP-C Geriatrics [facility] [address] [place], [place] [address] Cell Phone (Mon-Fri 8am-5pm):  [phone] On Call:  [phone] & follow  prompts after 5pm & weekends Office Phone:  [phone] Office Fax:  [phone]

## 2015-12-13 ENCOUNTER — Encounter
Admission: RE | Admit: 2015-12-13 | Discharge: 2015-12-13 | Disposition: A | Discharge status: Home / Self Care | Source: Ambulatory Visit | Attending: Internal Medicine | Admitting: Internal Medicine

## 2015-12-13 DIAGNOSIS — R609 Edema, unspecified: Secondary | ICD-10-CM | POA: Insufficient documentation

## 2015-12-14 ENCOUNTER — Non-Acute Institutional Stay (SKILLED_NURSING_FACILITY): Payer: Medicare Other | Admitting: Gerontology

## 2015-12-14 DIAGNOSIS — L03031 Cellulitis of right toe: Secondary | ICD-10-CM

## 2015-12-14 DIAGNOSIS — S91209A Unspecified open wound of unspecified toe(s) with damage to nail, initial encounter: Secondary | ICD-10-CM | POA: Diagnosis not present

## 2015-12-14 DIAGNOSIS — I509 Heart failure, unspecified: Secondary | ICD-10-CM | POA: Diagnosis not present

## 2015-12-15 NOTE — Progress Notes (Signed)
Location:      Place of Service:    Provider:  Toni Arthurs, NP-C  No primary care provider on file.  No care team member to display  Extended Emergency Contact Information Primary Emergency Contact: Delacruz,BRENDA F Address: [address]          [place], [place] [address] Home Phone: [phone] Relation: None  Code Status: DNR Goals of care: Advanced Directive information No flowsheet data found.   Chief Complaint  Patient presents with  . Acute Visit    HPI:  Pt is a 82 y.o. female seen today for an acute visit for sore toe and loose toenail. Area is very tender. Pt did not allow too close of an assessment. The toenail is extremely thick and discolored. It appears to be attached to the nailbed just by a thin piece of tissue. Surrounding tissue red, warm, inflammed, mild amount of purulent drainage from nail bed. Pt also has a h/o CHF. She has 2+ pitting edema in BLE. She had been laying in bed prior to assessment. Otherwise, pt denies nausea, vomiting, congestion, headache. VSS     No past medical history on file. No past surgical history on file.  Allergies  Allergen Reactions  . Lasix [Furosemide]   . Penicillins       Medication List    as of 12/14/2015 11:59 PM   You have not been prescribed any medications.     Review of Systems  Unable to perform ROS: Dementia  Constitutional: Negative for activity change, appetite change, chills, diaphoresis and fever.  HENT: Negative for congestion, sneezing, sore throat, trouble swallowing and voice change.   Eyes: Negative for pain, redness and visual disturbance.  Respiratory: Negative for apnea, cough, choking, chest tightness, shortness of breath and wheezing.   Cardiovascular: Negative for chest pain, palpitations and leg swelling.  Musculoskeletal: Positive for arthralgias (typical arthritis). Negative for back pain, gait problem and myalgias.  Skin: Negative for color change, pallor, rash and wound.    Neurological: Negative for dizziness, tremors, syncope, speech difficulty, weakness, numbness and headaches.  Psychiatric/Behavioral: Negative for agitation and behavioral problems.  All other systems reviewed and are negative.    There is no immunization history on file for this patient. Pertinent  Health Maintenance Due  Topic Date Due  . DEXA SCAN  08/31/1984  . PNA vac Low Risk Adult (1 of 2 - PCV13) 08/31/1984  . INFLUENZA VACCINE  12/13/2015   No flowsheet data found. Functional Status Survey:    Vitals:   12/12/15 0500  BP: (!) 138/51  Pulse: 70  Resp: 16  Temp: (!) 96.3 F (35.7 C)  SpO2: 100%  Weight: 129 lb 8 oz (58.7 kg)   There is no height or weight on file to calculate BMI. Physical Exam  Constitutional: She is oriented to person, place, and time. Vital signs are normal. She appears well-developed and well-nourished. She is active and cooperative. She does not appear ill. No distress.  HENT:  Head: Normocephalic and atraumatic.  Mouth/Throat: Uvula is midline, oropharynx is clear and moist and mucous membranes are normal. Mucous membranes are not pale, not dry and not cyanotic.  Eyes: Conjunctivae, EOM and lids are normal. Pupils are equal, round, and reactive to light.  Neck: Trachea normal, normal range of motion and full passive range of motion without pain. Neck supple. No JVD present. No tracheal deviation, no edema and no erythema present. No thyromegaly present.  Cardiovascular: Normal rate, regular rhythm, intact distal pulses  and normal pulses.  Exam reveals no gallop, no distant heart sounds and no friction rub.   No murmur heard. Pulmonary/Chest: Effort normal. No accessory muscle usage. No respiratory distress. She has no wheezes. She has rhonchi in the right lower field and the left lower field. She has rales in the right lower field and the left lower field. She exhibits no tenderness.  Abdominal: Normal appearance and bowel sounds are normal. She  exhibits no distension and no ascites. There is no tenderness.  Musculoskeletal: Normal range of motion. She exhibits no edema or tenderness.  Expected osteoarthritis, stiffness  Neurological: She is alert and oriented to person, place, and time. She has normal strength.  Skin: Skin is warm, dry and intact. She is not diaphoretic. There is erythema (right third toe). No cyanosis. No pallor. Nails show no clubbing.  Psychiatric: She has a normal mood and affect. Her speech is normal and behavior is normal. Judgment and thought content normal. Cognition and memory are normal.  Nursing note and vitals reviewed.   Labs reviewed:  Recent Labs  07/05/15 0936 10/14/15 1430  NA 140 137  K 5.0 4.4  CL 106 106  CO2 26 17*  GLUCOSE 109* 99  BUN 36* 36*  CREATININE 1.15* 1.05*  CALCIUM 9.7 9.8    Recent Labs  10/14/15 1430  AST 31  ALT 10*  ALKPHOS 76  BILITOT 0.2*  PROT 7.9  ALBUMIN 4.0    Recent Labs  07/05/15 0936 10/14/15 1430  WBC 9.3 11.2*  NEUTROABS 5.4 8.6*  HGB 13.5 14.1  HCT 40.3 42.6  MCV 97.8 97.4  PLT 217 294   No results found for: TSH No results found for: HGBA1C No results found for: CHOL, HDL, LDLCALC, LDLDIRECT, TRIG, CHOLHDL  Significant Diagnostic Results in last 30 days:  No results found.  Assessment/Plan 1. Paronychia of third toe of right foot  Epsom salt soaks- 1/8 cup salts mixed with basin of warm water. Soak foot until water is cool BID. Dry well. Then apply bacitracin ointment to the right third toe/toenail. Cover with Bandaid to protect  Bactrim DS x 1 tonight  Bactrim (1/2 strength) 1 tab po Q 12 hours x 10 doses  2. Toenail avulsion, initial encounter  Will consider toenail removal with Lidocaine cream tomorrow. In the meantime, see plan for paronychia  3. Acute Congestive Heart Failure, unspecified congestive heart failure type  Increase spironalactone to 37 mg po q day  Metolazone 2.5 mg po x 1 now (lasix allergy)  Check  Met B next week d/t increase in diuretic  Family/ staff Communication:   Total Time: 45 minutes  Documentation: 15 minutes  Face to Face: 10 minutes  Family/Phone: 20 minutes- spoke with Hospice RN regarding toes and formulating plan   Labs/tests ordered:  Met B next week  Medication list reviewed and assessed for continued appropriateness.  Vikki Ports, NP-C Geriatrics [facility] [phone] [place], [place] [address] Cell Phone (Mon-Fri 8am-5pm):  [phone] On Call:  [phone] & follow prompts after 5pm & weekends Office Phone:  [phone] Office Fax:  [phone]

## 2015-12-19 DIAGNOSIS — R609 Edema, unspecified: Secondary | ICD-10-CM | POA: Diagnosis not present

## 2015-12-19 LAB — BASIC METABOLIC PANEL
Anion gap: 9 (ref 5–15)
BUN: 34 mg/dL — ABNORMAL HIGH (ref 6–20)
CHLORIDE: 106 mmol/L (ref 101–111)
CO2: 25 mmol/L (ref 22–32)
CREATININE: 1.31 mg/dL — AB (ref 0.44–1.00)
Calcium: 9.6 mg/dL (ref 8.9–10.3)
GFR calc non Af Amer: 33 mL/min — ABNORMAL LOW (ref >60)
GFR, EST AFRICAN AMERICAN: 38 mL/min — AB (ref >60)
Glucose, Bld: 165 mg/dL — ABNORMAL HIGH (ref 65–99)
Potassium: 4.7 mmol/L (ref 3.5–5.1)
Sodium: 140 mmol/L (ref 135–145)

## 2016-01-13 ENCOUNTER — Inpatient Hospital Stay: Admit: 2016-01-13 | Payer: Self-pay

## 2016-01-13 ENCOUNTER — Encounter
Admission: RE | Admit: 2016-01-13 | Discharge: 2016-01-13 | Disposition: A | Discharge status: Home / Self Care | Payer: Medicare Other | Source: Ambulatory Visit | Attending: Internal Medicine | Admitting: Internal Medicine

## 2016-01-13 DIAGNOSIS — D649 Anemia, unspecified: Secondary | ICD-10-CM | POA: Insufficient documentation

## 2016-01-13 DIAGNOSIS — R05 Cough: Secondary | ICD-10-CM | POA: Insufficient documentation

## 2016-02-12 ENCOUNTER — Encounter
Admission: RE | Admit: 2016-02-12 | Discharge: 2016-02-12 | Disposition: A | Discharge status: Home / Self Care | Payer: Medicare Other | Source: Ambulatory Visit | Attending: Internal Medicine | Admitting: Internal Medicine

## 2016-02-16 DIAGNOSIS — J449 Chronic obstructive pulmonary disease, unspecified: Secondary | ICD-10-CM | POA: Diagnosis not present

## 2016-02-16 DIAGNOSIS — M810 Age-related osteoporosis without current pathological fracture: Secondary | ICD-10-CM | POA: Diagnosis not present

## 2016-02-16 DIAGNOSIS — E785 Hyperlipidemia, unspecified: Secondary | ICD-10-CM | POA: Diagnosis not present

## 2016-02-16 DIAGNOSIS — I482 Chronic atrial fibrillation: Secondary | ICD-10-CM | POA: Diagnosis not present

## 2016-02-16 DIAGNOSIS — I2510 Atherosclerotic heart disease of native coronary artery without angina pectoris: Secondary | ICD-10-CM

## 2016-02-16 DIAGNOSIS — Z66 Do not resuscitate: Secondary | ICD-10-CM

## 2016-02-16 DIAGNOSIS — Z515 Encounter for palliative care: Secondary | ICD-10-CM

## 2016-02-16 DIAGNOSIS — I739 Peripheral vascular disease, unspecified: Secondary | ICD-10-CM | POA: Diagnosis not present

## 2016-02-16 DIAGNOSIS — F0390 Unspecified dementia without behavioral disturbance: Secondary | ICD-10-CM

## 2016-03-14 ENCOUNTER — Encounter
Admission: RE | Admit: 2016-03-14 | Discharge: 2016-03-14 | Disposition: A | Discharge status: Home / Self Care | Payer: Medicare Other | Source: Ambulatory Visit | Attending: Internal Medicine | Admitting: Internal Medicine

## 2016-04-13 ENCOUNTER — Encounter
Admission: RE | Admit: 2016-04-13 | Discharge: 2016-04-13 | Disposition: A | Discharge status: Home / Self Care | Payer: Medicare Other | Source: Ambulatory Visit | Attending: Internal Medicine | Admitting: Internal Medicine

## 2016-05-14 ENCOUNTER — Encounter
Admission: RE | Admit: 2016-05-14 | Discharge: 2016-05-14 | Disposition: A | Discharge status: Home / Self Care | Payer: Medicare Other | Source: Ambulatory Visit | Attending: Internal Medicine | Admitting: Internal Medicine

## 2016-06-14 ENCOUNTER — Encounter
Admission: RE | Admit: 2016-06-14 | Discharge: 2016-06-14 | Disposition: A | Discharge status: Home / Self Care | Payer: Medicare Other | Source: Ambulatory Visit | Attending: Internal Medicine | Admitting: Internal Medicine

## 2016-06-14 DIAGNOSIS — R410 Disorientation, unspecified: Secondary | ICD-10-CM | POA: Insufficient documentation

## 2016-06-14 DIAGNOSIS — R109 Unspecified abdominal pain: Secondary | ICD-10-CM | POA: Insufficient documentation

## 2016-06-30 DIAGNOSIS — R410 Disorientation, unspecified: Secondary | ICD-10-CM | POA: Diagnosis not present

## 2016-06-30 DIAGNOSIS — R109 Unspecified abdominal pain: Secondary | ICD-10-CM | POA: Diagnosis not present

## 2016-06-30 LAB — COMPREHENSIVE METABOLIC PANEL
ALK PHOS: 60 U/L (ref 38–126)
ALT: 17 U/L (ref 14–54)
ANION GAP: 11 (ref 5–15)
AST: 40 U/L (ref 15–41)
Albumin: 4 g/dL (ref 3.5–5.0)
BILIRUBIN TOTAL: 0.7 mg/dL (ref 0.3–1.2)
BUN: 34 mg/dL — ABNORMAL HIGH (ref 6–20)
CALCIUM: 9.4 mg/dL (ref 8.9–10.3)
CO2: 24 mmol/L (ref 22–32)
CREATININE: 1.17 mg/dL — AB (ref 0.44–1.00)
Chloride: 107 mmol/L (ref 101–111)
GFR calc non Af Amer: 38 mL/min — ABNORMAL LOW (ref >60)
GFR, EST AFRICAN AMERICAN: 44 mL/min — AB (ref >60)
Glucose, Bld: 87 mg/dL (ref 65–99)
Potassium: 4.5 mmol/L (ref 3.5–5.1)
Sodium: 142 mmol/L (ref 135–145)
TOTAL PROTEIN: 7.6 g/dL (ref 6.5–8.1)

## 2016-07-02 ENCOUNTER — Non-Acute Institutional Stay (SKILLED_NURSING_FACILITY): Payer: Medicare Other | Admitting: Gerontology

## 2016-07-02 DIAGNOSIS — L03116 Cellulitis of left lower limb: Secondary | ICD-10-CM | POA: Diagnosis not present

## 2016-07-02 DIAGNOSIS — R109 Unspecified abdominal pain: Secondary | ICD-10-CM | POA: Diagnosis not present

## 2016-07-02 LAB — CBC WITH DIFFERENTIAL/PLATELET
BASOS ABS: 0 10*3/uL (ref 0–0.1)
BASOS PCT: 0 %
EOS ABS: 0 10*3/uL (ref 0–0.7)
EOS PCT: 0 %
HCT: 32.1 % — ABNORMAL LOW (ref 35.0–47.0)
Hemoglobin: 10.8 g/dL — ABNORMAL LOW (ref 12.0–16.0)
Lymphocytes Relative: 6 %
Lymphs Abs: 1 10*3/uL (ref 1.0–3.6)
MCH: 32.8 pg (ref 26.0–34.0)
MCHC: 33.5 g/dL (ref 32.0–36.0)
MCV: 97.8 fL (ref 80.0–100.0)
Monocytes Absolute: 1 10*3/uL — ABNORMAL HIGH (ref 0.2–0.9)
Monocytes Relative: 6 %
Neutro Abs: 14.9 10*3/uL — ABNORMAL HIGH (ref 1.4–6.5)
Neutrophils Relative %: 88 %
PLATELETS: 214 10*3/uL (ref 150–440)
RBC: 3.29 MIL/uL — AB (ref 3.80–5.20)
RDW: 14.6 % — ABNORMAL HIGH (ref 11.5–14.5)
WBC: 17 10*3/uL — AB (ref 3.6–11.0)

## 2016-07-02 LAB — SEDIMENTATION RATE: SED RATE: 107 mm/h — AB (ref 0–30)

## 2016-07-05 ENCOUNTER — Non-Acute Institutional Stay (SKILLED_NURSING_FACILITY): Payer: Medicare Other | Admitting: Gerontology

## 2016-07-05 DIAGNOSIS — L03116 Cellulitis of left lower limb: Secondary | ICD-10-CM | POA: Diagnosis not present

## 2016-07-05 DIAGNOSIS — R062 Wheezing: Secondary | ICD-10-CM

## 2016-07-06 DIAGNOSIS — R109 Unspecified abdominal pain: Secondary | ICD-10-CM | POA: Diagnosis not present

## 2016-07-06 LAB — URINALYSIS, COMPLETE (UACMP) WITH MICROSCOPIC
BILIRUBIN URINE: NEGATIVE
Glucose, UA: NEGATIVE mg/dL
HGB URINE DIPSTICK: NEGATIVE
KETONES UR: NEGATIVE mg/dL
LEUKOCYTES UA: NEGATIVE
NITRITE: NEGATIVE
Protein, ur: NEGATIVE mg/dL
SPECIFIC GRAVITY, URINE: 1.026 (ref 1.005–1.030)
Squamous Epithelial / HPF: NONE SEEN
pH: 5 (ref 5.0–8.0)

## 2016-07-07 LAB — URINE CULTURE: Culture: NO GROWTH

## 2016-07-08 NOTE — Progress Notes (Signed)
Location:      Place of Service:  SNF (31) Provider:  Lorenso QuarryShannon Emrys Mceachron, NP-C  No primary care provider on file.  No care team member to display  Extended Emergency Contact Information Primary Emergency Contact: Comes,BRENDA F Address: [address]          [place], [place] [address] Home Phone: [phone] Relation: None  Code Status:  dnr Goals of care: Advanced Directive information No flowsheet data found.   Chief Complaint  Patient presents with  . Acute Visit    HPI:  Pt is a 83 y.o. female seen today for follow up/re-check of left hip cellulitis. Skin is no longer red, angry, warm, tender to touch. Pt continues to complain of pain. Pt is also displaying non-verbal cues of severe pain. She does complain of difficulty breathing- but she is holding her breath with movement of the left leg. Now, skin on dorsal aspect of left foot is red, warm, tender. The foot is edematous. No reports of trauma, no falls. Severe pain with light palpation or movement of the foot. Unable to tolerate blanket on the foot. Pt also having wheezing. Denies cough, congestion. No sputum. No fever.  Pt speech is very difficult to understand so complete ROS was difficult to obtain. Heart rate elevated. Other VSS.    No past medical history on file. No past surgical history on file.  Allergies  Allergen Reactions  . Lasix [Furosemide]   . Penicillins     Allergies as of 07/05/2016      Reactions   Lasix [furosemide]    Penicillins       Medication List    as of 07/05/2016 11:59 PM   You have not been prescribed any medications.     Review of Systems  Unable to perform ROS: Dementia  Constitutional: Negative for activity change, appetite change, chills, diaphoresis and fever.  HENT: Negative for congestion, sneezing, sore throat, trouble swallowing and voice change.   Eyes: Negative for pain, redness and visual disturbance.  Respiratory: Negative for apnea, cough, choking, chest  tightness, shortness of breath and wheezing.   Cardiovascular: Negative for chest pain, palpitations and leg swelling.  Gastrointestinal: Negative.   Genitourinary: Negative.   Musculoskeletal: Positive for arthralgias (typical arthritis) and joint swelling. Negative for back pain, gait problem and myalgias.  Skin: Positive for color change. Negative for pallor, rash and wound.  Neurological: Negative for dizziness, tremors, syncope, speech difficulty, weakness, numbness and headaches.  Psychiatric/Behavioral: Negative for agitation and behavioral problems.  All other systems reviewed and are negative.    There is no immunization history on file for this patient. Pertinent  Health Maintenance Due  Topic Date Due  . DEXA SCAN  08/31/1984  . PNA vac Low Risk Adult (1 of 2 - PCV13) 08/31/1984  . INFLUENZA VACCINE  12/13/2015   No flowsheet data found. Functional Status Survey:    Vitals:   07/05/16 1330  BP: (!) 134/57  Pulse: 90  Resp: (!) 24  Temp: 98.5 F (36.9 C)  SpO2: 100%   There is no height or weight on file to calculate BMI. Physical Exam  Constitutional: She is oriented to person, place, and time. Vital signs are normal. She appears well-developed and well-nourished. She is active and cooperative. She does not appear ill. No distress.  HENT:  Head: Normocephalic and atraumatic.  Mouth/Throat: Uvula is midline, oropharynx is clear and moist and mucous membranes are normal. Mucous membranes are not pale, not dry and not cyanotic.  Eyes: Conjunctivae, EOM and lids are normal. Pupils are equal, round, and reactive to light.  Neck: Trachea normal, normal range of motion and full passive range of motion without pain. Neck supple. No JVD present. No tracheal deviation, no edema and no erythema present. No thyromegaly present.  Cardiovascular: Normal rate, regular rhythm, intact distal pulses and normal pulses.  Exam reveals no gallop, no distant heart sounds and no friction  rub.   No murmur heard. Pulmonary/Chest: Effort normal. No accessory muscle usage. Tachypnea (r/t pain) noted. No respiratory distress. She has decreased breath sounds in the right lower field and the left lower field. She has wheezes. She has no rhonchi. She has no rales. She exhibits no tenderness.  Abdominal: Normal appearance and bowel sounds are normal. She exhibits no distension and no ascites. There is no tenderness.  Musculoskeletal: Normal range of motion. She exhibits no edema or tenderness.  Expected osteoarthritis, stiffness  Neurological: She is alert and oriented to person, place, and time. She has normal strength.  Skin: Skin is warm, dry and intact. No rash noted. She is not diaphoretic. There is erythema (left foot). No cyanosis. No pallor. Nails show no clubbing.  Psychiatric: She has a normal mood and affect. Her speech is normal and behavior is normal. Judgment and thought content normal. Cognition and memory are normal.  Nursing note and vitals reviewed.   Labs reviewed:  Recent Labs  10/14/15 1430 12/19/15 1035 06/30/16 0800  NA 137 140 142  K 4.4 4.7 4.5  CL 106 106 107  CO2 17* 25 24  GLUCOSE 99 165* 87  BUN 36* 34* 34*  CREATININE 1.05* 1.31* 1.17*  CALCIUM 9.8 9.6 9.4    Recent Labs  10/14/15 1430 06/30/16 0800  AST 31 40  ALT 10* 17  ALKPHOS 76 60  BILITOT 0.2* 0.7  PROT 7.9 7.6  ALBUMIN 4.0 4.0    Recent Labs  10/14/15 1430 06/30/16 1250  WBC 11.2* 17.0*  NEUTROABS 8.6* 14.9*  HGB 14.1 10.8*  HCT 42.6 32.1*  MCV 97.4 97.8  PLT 294 214   No results found for: TSH No results found for: HGBA1C No results found for: CHOL, HDL, LDLCALC, LDLDIRECT, TRIG, CHOLHDL  Significant Diagnostic Results in last 30 days:  No results found.  Assessment/Plan 1. Cellulitis of left lower extremity  Xrays  Continue Clindamycin until course completed  Morphine 20 mg/ml 0.25-0.5 ml po Q 1 hour prn- pain    2. Wheezing  Solumedrol 125 mg IM x  1 now  Prednisone taper- 40 mg po Q day x 3 days, then   20 mg po Q Day x 3 day, then   10 mg po Q day x 4 days  Duonebs Q 6 hours prn  Family/ staff Communication:   Total Time:  Documentation:  Face to Face:  Family/Phone:   Labs/tests ordered:  UA, C&S, Left hip xray, Left foot/ankle xray, KUB  Medication list reviewed and assessed for continued appropriateness. Monthly medication orders reviewed and signed.  Brynda Rim, NP-C Geriatrics [facility] [phone] [address], [place] [address] Cell Phone (Mon-Fri 8am-5pm):  [phone] On Call:  [phone] & follow prompts after 5pm & weekends Office Phone:  [phone] Office Fax:  [phone]

## 2016-07-08 NOTE — Progress Notes (Signed)
Location:      Place of Service:  SNF (31) Provider:  Toni Arthurs, NP-C  No primary care provider on file.  No care team member to display  Extended Emergency Contact Information Primary Emergency Contact: Eshleman,BRENDA F Address: [address]          [place], [place] [address] Home Phone: [phone] Relation: None  Code Status:  dnr Goals of care: Advanced Directive information No flowsheet data found.   Chief Complaint  Patient presents with  . Acute Visit    HPI:  Pt is a 83 y.o. female seen today for an acute visit for cellulitis of the left hip. I was notified over the weekend of pt having severe pain in the left hip. The skin was described as red, warm/hot to touch, tender, edematous. This is located at the proximal upper thigh, anterior hip. The redness extends around to the posterior portion of the upper thigh. Pt was not wearing thigh-high teds or any other clothing with elastic. No known trauma. No recent falls. No recent weight gain. No break in skin integrity. No obvious cause/ source. No fever. VSS. No other complaints.     No past medical history on file. No past surgical history on file.  Allergies  Allergen Reactions  . Lasix [Furosemide]   . Penicillins     Allergies as of 07/02/2016      Reactions   Lasix [furosemide]    Penicillins       Medication List    as of 07/02/2016 11:59 PM   You have not been prescribed any medications.     Review of Systems  Unable to perform ROS: Dementia  Constitutional: Negative for activity change, appetite change, chills, diaphoresis and fever.  HENT: Negative for congestion, sneezing, sore throat, trouble swallowing and voice change.   Eyes: Negative for pain, redness and visual disturbance.  Respiratory: Negative for apnea, cough, choking, chest tightness, shortness of breath and wheezing.   Cardiovascular: Negative for chest pain, palpitations and leg swelling.  Gastrointestinal: Negative.     Genitourinary: Negative.   Musculoskeletal: Positive for arthralgias (typical arthritis) and joint swelling. Negative for back pain, gait problem and myalgias.  Skin: Positive for color change. Negative for pallor, rash and wound.  Neurological: Negative for dizziness, tremors, syncope, speech difficulty, weakness, numbness and headaches.  Psychiatric/Behavioral: Negative for agitation and behavioral problems.  All other systems reviewed and are negative.    There is no immunization history on file for this patient. Pertinent  Health Maintenance Due  Topic Date Due  . DEXA SCAN  08/31/1984  . PNA vac Low Risk Adult (1 of 2 - PCV13) 08/31/1984  . INFLUENZA VACCINE  12/13/2015   No flowsheet data found. Functional Status Survey:    Vitals:   07/02/16 1600  BP: 116/62  Pulse: 78  Temp: 97.9 F (36.6 C)   There is no height or weight on file to calculate BMI. Physical Exam  Constitutional: She is oriented to person, place, and time. Vital signs are normal. She appears well-developed and well-nourished. She is active and cooperative. She does not appear ill. No distress.  HENT:  Head: Normocephalic and atraumatic.  Mouth/Throat: Uvula is midline, oropharynx is clear and moist and mucous membranes are normal. Mucous membranes are not pale, not dry and not cyanotic.  Eyes: Conjunctivae, EOM and lids are normal. Pupils are equal, round, and reactive to light.  Neck: Trachea normal, normal range of motion and full passive range of motion without  pain. Neck supple. No JVD present. No tracheal deviation, no edema and no erythema present. No thyromegaly present.  Cardiovascular: Normal rate, regular rhythm, intact distal pulses and normal pulses.  Exam reveals no gallop, no distant heart sounds and no friction rub.   No murmur heard. Pulmonary/Chest: Effort normal. No accessory muscle usage. No respiratory distress. She has no decreased breath sounds. She has no wheezes. She has no  rhonchi. She has no rales. She exhibits no tenderness.  Abdominal: Normal appearance and bowel sounds are normal. She exhibits no distension and no ascites. There is no tenderness.  Musculoskeletal: Normal range of motion. She exhibits no edema or tenderness.  Expected osteoarthritis, stiffness  Neurological: She is alert and oriented to person, place, and time. She has normal strength.  Skin: Skin is warm, dry and intact. No rash noted. She is not diaphoretic. There is erythema (left hip). No cyanosis. No pallor. Nails show no clubbing.  Psychiatric: She has a normal mood and affect. Her speech is normal and behavior is normal. Judgment and thought content normal. Cognition and memory are normal.  Nursing note and vitals reviewed.   Labs reviewed:  Recent Labs  10/14/15 1430 12/19/15 1035 06/30/16 0800  NA 137 140 142  K 4.4 4.7 4.5  CL 106 106 107  CO2 17* 25 24  GLUCOSE 99 165* 87  BUN 36* 34* 34*  CREATININE 1.05* 1.31* 1.17*  CALCIUM 9.8 9.6 9.4    Recent Labs  10/14/15 1430 06/30/16 0800  AST 31 40  ALT 10* 17  ALKPHOS 76 60  BILITOT 0.2* 0.7  PROT 7.9 7.6  ALBUMIN 4.0 4.0    Recent Labs  10/14/15 1430 06/30/16 1250  WBC 11.2* 17.0*  NEUTROABS 8.6* 14.9*  HGB 14.1 10.8*  HCT 42.6 32.1*  MCV 97.4 97.8  PLT 294 214   No results found for: TSH No results found for: HGBA1C No results found for: CHOL, HDL, LDLCALC, LDLDIRECT, TRIG, CHOLHDL  Significant Diagnostic Results in last 30 days:  No results found.  Assessment/Plan 1. Cellulitis of left lower extremity  Clindamycin 300 mg po Q 8 hours x 7 days  RisaBid 1 capsule BID until 2/25  Warm compresses QID until healed  Zinc cream to area BID until healed   Family/ staff Communication:   Total Time:  Documentation:  Face to Face:  Family/Phone:   Labs/tests ordered:  Cbc, met c, crp, esr  Medication list reviewed and assessed for continued appropriateness.  Vikki Ports,  NP-C Geriatrics [facility] [phone], [place] [address] Cell Phone (Mon-Fri 8am-5pm):  [phone] On Call:  [phone] & follow prompts after 5pm & weekends Office Phone:  [phone] Office Fax:  [phone]

## 2016-07-12 ENCOUNTER — Encounter
Admission: RE | Admit: 2016-07-12 | Discharge: 2016-07-12 | Disposition: A | Discharge status: Home / Self Care | Payer: Medicare Other | Source: Ambulatory Visit | Attending: Internal Medicine | Admitting: Internal Medicine

## 2016-07-17 ENCOUNTER — Non-Acute Institutional Stay (SKILLED_NURSING_FACILITY): Payer: Medicare Other | Admitting: Gerontology

## 2016-07-17 DIAGNOSIS — Z515 Encounter for palliative care: Secondary | ICD-10-CM

## 2016-07-17 DIAGNOSIS — R601 Generalized edema: Secondary | ICD-10-CM | POA: Diagnosis not present

## 2016-07-25 NOTE — Progress Notes (Signed)
Location:      Place of Service:  SNF (31) Provider:  Lorenso QuarryShannon Khyra Viscuso, NP-C  No primary care provider on file.  No care team member to display  Extended Emergency Contact Information Primary Emergency Contact: Gullatt,BRENDA F Address: [address]          [place], [place] [address] Home Phone: [phone] Relation: None  Code Status:  DNR Goals of care: Advanced Directive information No flowsheet data found.   Chief Complaint  Patient presents with  . Follow-up    HPI:  Pt is a 83 y.o. female seen today for an acute visit for edema and abdominal distention. Nursing noticed the change and asked me to evaluate. However, the edema is minimal in the feet/ankles. Abdomen is mildly distended. Hypoactive bowel sounds. Pt has not been eating or drinking. Denies pain or shortness of breath. Pt is barely able to speak above a whisper. She will not focus her eyes or attention on me or anyone else. She is focused on the ceiling with a "distant stare." Minimally interactive. Her respirations are shallow. Denies pain unless abdomen or foot is palpated. It appears the pt is early actively dying. VSS.    No past medical history on file. No past surgical history on file.  Allergies  Allergen Reactions  . Lasix [Furosemide]   . Penicillins     Allergies as of 07/17/2016      Reactions   Lasix [furosemide]    Penicillins       Medication List    as of 07/17/2016 11:59 PM   You have not been prescribed any medications.     Review of Systems  Unable to perform ROS: Dementia  Constitutional: Negative for activity change, appetite change, chills, diaphoresis and fever.  HENT: Negative for congestion, sneezing, sore throat, trouble swallowing and voice change.   Eyes: Negative for pain, redness and visual disturbance.  Respiratory: Negative for apnea, cough, choking, chest tightness, shortness of breath and wheezing.   Cardiovascular: Negative for chest pain.  Gastrointestinal:  Positive for abdominal distention and abdominal pain.  Genitourinary: Negative.   Musculoskeletal: Positive for arthralgias (typical arthritis) and joint swelling. Negative for back pain, gait problem and myalgias.  Skin: Positive for color change. Negative for pallor, rash and wound.  Neurological: Negative for dizziness, tremors, syncope, speech difficulty, weakness, numbness and headaches.  Psychiatric/Behavioral: Negative for agitation and behavioral problems.  All other systems reviewed and are negative.    There is no immunization history on file for this patient. Pertinent  Health Maintenance Due  Topic Date Due  . DEXA SCAN  08/31/1984  . PNA vac Low Risk Adult (1 of 2 - PCV13) 08/31/1984  . INFLUENZA VACCINE  12/13/2015   No flowsheet data found. Functional Status Survey:    Vitals:   07/16/16 1420  BP: (!) 142/64  Pulse: 67   There is no height or weight on file to calculate BMI. Physical Exam  Constitutional: Vital signs are normal. She appears well-developed and well-nourished. She is active and cooperative. She does not appear ill. No distress. Nasal cannula in place.  HENT:  Head: Normocephalic and atraumatic.  Mouth/Throat: Uvula is midline, oropharynx is clear and moist and mucous membranes are normal. Mucous membranes are not pale, not dry and not cyanotic.  Eyes: Conjunctivae, EOM and lids are normal. Pupils are equal, round, and reactive to light.  Neck: Trachea normal, normal range of motion and full passive range of motion without pain. Neck supple. No JVD  present. No tracheal deviation, no edema and no erythema present. No thyromegaly present.  Cardiovascular: Normal rate, regular rhythm, intact distal pulses and normal pulses.  Exam reveals no gallop, no distant heart sounds and no friction rub.   No murmur heard. Pulmonary/Chest: Effort normal. No accessory muscle usage. Tachypnea (r/t pain) noted. No respiratory distress. She has decreased breath sounds  (shallow) in the right upper field, the right middle field, the right lower field, the left upper field, the left middle field and the left lower field. She has no wheezes. She has no rhonchi. She has no rales. She exhibits no tenderness.  Abdominal: Normal appearance and bowel sounds are normal. She exhibits no distension and no ascites. There is no tenderness.  Musculoskeletal: Normal range of motion. She exhibits no edema or tenderness.  Expected osteoarthritis, stiffness  Neurological: She is alert. She has normal strength.  Skin: Skin is warm, dry and intact. No rash noted. She is not diaphoretic. No cyanosis or erythema (left foot). No pallor. Nails show no clubbing.  Psychiatric: She has a normal mood and affect. Her speech is normal and behavior is normal. Judgment and thought content normal. Cognition and memory are normal.  Nursing note and vitals reviewed.   Labs reviewed:  Recent Labs  10/14/15 1430 12/19/15 1035 06/30/16 0800  NA 137 140 142  K 4.4 4.7 4.5  CL 106 106 107  CO2 17* 25 24  GLUCOSE 99 165* 87  BUN 36* 34* 34*  CREATININE 1.05* 1.31* 1.17*  CALCIUM 9.8 9.6 9.4    Recent Labs  10/14/15 1430 06/30/16 0800  AST 31 40  ALT 10* 17  ALKPHOS 76 60  BILITOT 0.2* 0.7  PROT 7.9 7.6  ALBUMIN 4.0 4.0    Recent Labs  10/14/15 1430 06/30/16 1250  WBC 11.2* 17.0*  NEUTROABS 8.6* 14.9*  HGB 14.1 10.8*  HCT 42.6 32.1*  MCV 97.4 97.8  PLT 294 214   No results found for: TSH No results found for: HGBA1C No results found for: CHOL, HDL, LDLCALC, LDLDIRECT, TRIG, CHOLHDL  Significant Diagnostic Results in last 30 days:  No results found.  Assessment/Plan 1. Generalized edema  No treatment  2. Encounter for dying care  Comfort meds in place  Morphine concentrate 20 mg/mL 0.25-0.5 mL by mouth every hour when necessary pain, dyspnea  Morphine concentrate 20 mg/ml 0.5 mL po Q 4 hours ATC  Lorazepam 0.5 mg po Q 4 hours prn anxiety, agitation,  restlessness  Atropine 1%- drops- 2 drops under the tongue Q 30 minutes prn- secretions    Family/ staff Communication:   Total Time:  Documentation:  Face to Face:  Family/Phone:   Labs/tests ordered:    Medication list reviewed and assessed for continued appropriateness.  Brynda Rim, NP-C Geriatrics [facility] [phone] [address] [place], [place] [address] Cell Phone (Mon-Fri 8am-5pm):  [phone] On Call:  [phone] & follow prompts after 5pm & weekends Office Phone:  [phone] Office Fax:  [phone]

## 2016-08-12 ENCOUNTER — Encounter: Admission: RE | Admit: 2016-08-12 | Payer: Medicare Other | Source: Ambulatory Visit | Admitting: Internal Medicine

## 2016-08-12 DEATH — deceased
